# Patient Record
Sex: Female | Born: 1985 | Race: White | Hispanic: No | Smoking: Never smoker
Health system: Southern US, Community
[De-identification: ages and names within clinical notes are randomized; demographics above are authoritative.]

## PROBLEM LIST (undated history)

## (undated) ENCOUNTER — Ambulatory Visit (HOSPITAL_COMMUNITY): Admission: EM | Payer: 59 | Source: Home / Self Care

## (undated) DIAGNOSIS — F329 Major depressive disorder, single episode, unspecified: Secondary | ICD-10-CM

## (undated) DIAGNOSIS — F419 Anxiety disorder, unspecified: Secondary | ICD-10-CM

## (undated) DIAGNOSIS — F32A Depression, unspecified: Secondary | ICD-10-CM

## (undated) DIAGNOSIS — T7840XA Allergy, unspecified, initial encounter: Secondary | ICD-10-CM

## (undated) HISTORY — DX: Major depressive disorder, single episode, unspecified: F32.9

## (undated) HISTORY — DX: Depression, unspecified: F32.A

## (undated) HISTORY — PX: COLPOSCOPY: SHX161

## (undated) HISTORY — DX: Allergy, unspecified, initial encounter: T78.40XA

## (undated) HISTORY — DX: Anxiety disorder, unspecified: F41.9

---

## 2016-11-24 HISTORY — PX: WISDOM TOOTH EXTRACTION: SHX21

## 2017-06-16 ENCOUNTER — Encounter: Payer: Self-pay | Admitting: Family Medicine

## 2017-06-16 ENCOUNTER — Ambulatory Visit (INDEPENDENT_AMBULATORY_CARE_PROVIDER_SITE_OTHER): Payer: BLUE CROSS/BLUE SHIELD | Admitting: Family Medicine

## 2017-06-16 VITALS — BP 133/81 | HR 94 | Ht 65.5 in | Wt 162.0 lb

## 2017-06-16 DIAGNOSIS — Z803 Family history of malignant neoplasm of breast: Secondary | ICD-10-CM

## 2017-06-16 DIAGNOSIS — J3089 Other allergic rhinitis: Secondary | ICD-10-CM

## 2017-06-16 DIAGNOSIS — F32A Depression, unspecified: Secondary | ICD-10-CM

## 2017-06-16 DIAGNOSIS — E663 Overweight: Secondary | ICD-10-CM

## 2017-06-16 DIAGNOSIS — F329 Major depressive disorder, single episode, unspecified: Secondary | ICD-10-CM

## 2017-06-16 DIAGNOSIS — R87810 Cervical high risk human papillomavirus (HPV) DNA test positive: Secondary | ICD-10-CM

## 2017-06-16 DIAGNOSIS — Z8249 Family history of ischemic heart disease and other diseases of the circulatory system: Secondary | ICD-10-CM | POA: Diagnosis not present

## 2017-06-16 DIAGNOSIS — Z833 Family history of diabetes mellitus: Secondary | ICD-10-CM | POA: Diagnosis not present

## 2017-06-16 MED ORDER — VENLAFAXINE HCL 75 MG PO TABS: 75.0000 mg | ORAL_TABLET | Freq: Every day | ORAL | 1 refills | Status: DC

## 2017-06-16 NOTE — Patient Instructions (Signed)
Checkout lose it app, my fitness pal.  Also follow-up in 4 weeks and think about health and wellness goals that you could commit to.     Behavior Modification Ideas for Weight Management  Weight management involves adopting a healthy lifestyle that includes a knowledge of nutrition and exercise, a positive attitude and the right kind of motivation. Internal motives such as better health, increased energy, self-esteem and personal control increase your chances of lifelong weight management success.  Remember to have realistic goals and think long-term success. Believe in yourself and you can do it. The following information will give you ideas to help you meet your goals.  Control Your Home Environment  Eat only while sitting down at the kitchen or dining room table. Do not eat while watching television, reading, cooking, talking on the phone, standing at the refrigerator or working on the computer. Keep tempting foods out of the house - don't buy them. Keep tempting foods out of sight. Have low-calorie foods ready to eat. Unless you are preparing a meal, stay out of the kitchen. Have healthy snacks at your disposal, such as small pieces of fruit, vegetables, canned fruit, pretzels, low-fat string cheese and nonfat cottage cheese.  Control Your Work Environment  Do not eat at [employer] or keep tempting snacks at your desk. If you get hungry between meals, plan healthy snacks and bring them with you to work. During your breaks, go for a walk instead of eating. If you work around food, plan in advance the one item you will eat at mealtime. Make it inconvenient to nibble on food by chewing gum, sugarless candy or drinking water or another low-calorie beverage. Do not work through meals. Skipping meals slows down metabolism and may result in overeating at the next meal. If food is available for special occasions, either pick the healthiest item, nibble on low-fat snacks brought from home, don't  have anything offered, choose one option and have a small amount, or have only a beverage.  Control Your Mealtime Environment  Serve your plate of food at the stove or kitchen counter. Do not put the serving dishes on the table. If you do put dishes on the table, remove them immediately when finished eating. Fill half of your plate with vegetables, a quarter with lean protein and a quarter with starch. Use smaller plates, bowls and glasses. A smaller portion will look large when it is in a little dish. Politely refuse second helpings. When fixing your plate, limit portions of food to one scoop/serving or less.   Daily Food Management  Replace eating with another activity that you will not associate with food. Wait 20 minutes before eating something you are craving. Drink a large glass of water or diet soda before eating. Always have a big glass or bottle of water to drink throughout the day. Avoid high-calorie add-ons such as cream with your coffee, butter, mayonnaise and salad dressings.  Shopping: Do not shop when hungry or tired. Shop from a list and avoid buying anything that is not on your list. If you must have tempting foods, buy individual-sized packages and try to find a lower-calorie alternative. Don't taste test in the store. Read food labels. Compare products to help you make the healthiest choices.  Preparation: Chew a piece of gum while cooking meals. Use a quarter teaspoon if you taste test your food. Try to only fix what you are going to eat, leaving yourself no chance for seconds. If you have prepared more  food than you need, portion it into individual containers and freeze or refrigerate immediately. Don't snack while cooking meals.  Eating: Eat slowly. Remember it takes about 20 minutes for your stomach to send a message to your brain that it is full. Don't let fake hunger make you think you need more. The ideal way to eat is to take a bite, put your utensil  down, take a sip of water, cut your next bite, take a bit, put your utensil down and so on. Do not cut your food all at one time. Cut only as needed. Take small bites and chew your food well. Stop eating for a minute or two at least once during a meal or snack. Take breaks to reflect and have conversation.  Cleanup and Leftovers: Label leftovers for a specific meal or snack. Freeze or refrigerate individual portions of leftovers. Do not clean up if you are still hungry.  Eating Out and Social Eating  Do not arrive hungry. Eat something light before the meal. Try to fill up on low-calorie foods, such as vegetables and fruit, and eat smaller portions of the high-calorie foods. Eat foods that you like, but choose small portions. If you want seconds, wait at least 20 minutes after you have eaten to see if you are actually hungry or if your eyes are bigger than your stomach. Limit alcoholic beverages. Try a soda water with a twist of lime. Do not skip other meals in the day to save room for the special event.  At Restaurants: Order  la carte rather than buffet style. Order some vegetables or a salad for an appetizer instead of eating bread. If you order a high-calorie dish, share it with someone. Try an after-dinner mint with your coffee. If you do have dessert, share it with two or more people. Don't overeat because you do not want to waste food. Ask for a doggie bag to take extra food home. Tell the server to put half of your entree in a to go bag before the meal is served to you. Ask for salad dressing, gravy or high-fat sauces on the side. Dip the tip of your fork in the dressing before each bite. If bread is served, ask for only one piece. Try it plain without butter or oil. At [employer] where oil and vinegar is served with bread, use only a small amount of oil and a lot of vinegar for dipping.  At a Friend's House: Offer to bring a dish, appetizer or dessert that is low in  calories. Serve yourself small portions or tell the host that you only want a small amount. Stand or sit away from the snack table. Stay away from the kitchen or stay busy if you are near the food. Limit your alcohol intake.  At [employer] and Cafeterias: Cover most of your plate with lettuce and/or vegetables. Use a salad plate instead of a dinner plate. After eating, clear away your dishes before having coffee or tea.  Entertaining at Home: Explore low-fat, low-cholesterol cookbooks. Use single-serving foods like chicken breasts or hamburger patties. Prepare low-calorie appetizers and desserts.   Holidays: Keep tempting foods out of sight. Decorate the house without using food. Have low-calorie beverages and foods on hand for guests. Allow yourself one planned treat a day. Don't skip meals to save up for the holiday feast. Eat regular, planned meals.   Exercise Well  Make exercise a priority and a planned activity in the day. If possible, walk the entire  or part of the distance to work. Get an exercise buddy. Go for a walk with a colleague during one of your breaks, go to the gym, run or take a walk with a friend, walk in the mall with a shopping companion. Park at the end of the parking lot and walk to the store or office entrance. Always take the stairs all of the way or at least part of the way to your floor. If you have a desk job, walk around the office frequently. Do leg lifts while sitting at your desk. Do something outside on the weekends like going for a hike or a bike ride.   Have a Healthy Attitude  Make health your weight management priority. Be realistic. Have a goal to achieve a healthier you, not necessarily the lowest weight or ideal weight based on calculations or tables. Focus on a healthy eating style, not on dieting. Dieting usually lasts for a short amount of time and rarely produces long-term success. Think long term. You are developing new healthy  behaviors to follow next month, in a year and in a decade.    This information is for educational purposes only and is not intended to replace the advice of your doctor or health care provider. We encourage you to discuss with your doctor any questions or concerns you may have.        Guidelines for Losing Weight   We want weight loss that will last so you should lose 1-2 pounds a week.  THAT IS IT! Please pick THREE things a month to change. Once it is a habit check off the item. Then pick another three items off the list to become habits.  If you are already doing a habit on the list GREAT!  Cross that item off!  Don't drink your calories. Ie, alcohol, soda, fruit juice, and sweet tea.   Drink more water. Drink a glass when you feel hungry or before each meal.   Eat breakfast - Complex carb and protein (likeDannon light and fit yogurt, oatmeal, fruit, eggs, [place] bacon).  Measure your cereal.  Eat no more than one cup a day. (ie Kashi)  Eat an apple a day.  Add a vegetable a day.  Try a new vegetable a month.  Use Pam! Stop using oil or butter to cook.  Don't finish your plate or use smaller plates.  Share your dessert.  Eat sugar free Jello for dessert or frozen grapes.  Don't eat 2-3 hours before bed.  Switch to whole wheat bread, pasta, and brown rice.  Make healthier choices when you eat out. No fries!  Pick baked chicken, NOT fried.  Don't forget to SLOW DOWN when you eat. It is not going anywhere.   Take the stairs.  Park far away in the parking lot  Lift soup cans (or weights) for 10 minutes while watching TV.  Walk at work for 10 minutes during break.  Walk outside 1 time a week with your friend, kids, dog, or significant other.  Start a walking group at church.  Walk the mall as much as you can tolerate.   Keep a food diary.  Weigh yourself daily.  Walk for 15 minutes 3 days per week.  Cook at home more often and eat out less. If life  happens and you go back to old habits, it is okay.  Just start over. You can do it!  If you experience chest pain, get short of breath, or tired during  the exercise, please stop immediately and inform your doctor.    Before you even begin to attack a weight-loss plan, it pays to remember this: You are not fat. You have fat. Losing weight isn't about blame or shame; it's simply another achievement to accomplish. Dieting is like any other skill-you have to buckle down and work at it. As long as you act in a smart, reasonable way, you'll ultimately get where you want to be. Here are some weight loss pearls for you.   1. It's Not a Diet. It's a Lifestyle Thinking of a diet as something you're on and suffering through only for the short term doesn't work. To shed weight and keep it off, you need to make permanent changes to the way you eat. It's OK to indulge occasionally, of course, but if you cut calories temporarily and then revert to your old way of eating, you'll gain back the weight quicker than you can say yo-yo. Use it to lose it. Research shows that one of the best predictors of long-term weight loss is how many pounds you drop in the first month. For that reason, nutritionists often suggest being stricter for the first two weeks of your new eating strategy to build momentum. Cut out added sugar and alcohol and avoid unrefined carbs. After that, figure out how you can reincorporate them in a way that's healthy and maintainable.  2. There's a Right Way to Exercise Working out burns calories and fat and boosts your metabolism by building muscle. But those trying to lose weight are notorious for overestimating the number of calories they burn and underestimating the amount they take in. Unfortunately, your system is biologically programmed to hold on to extra pounds and that means when you start exercising, your body senses the deficit and ramps up its hunger signals. If you're not diligent, you'll eat  everything you burn and then some. Use it, to lose it. Cardio gets all the exercise glory, but strength and interval training are the real heroes. They help you build lean muscle, which in turn increases your metabolism and calorie-burning ability 3. Don't Overreact to Mild Hunger Some people have a hard time losing weight because of hunger anxiety. To them, being hungry is bad-something to be avoided at all costs-so they carry snacks with them and eat when they don't need to. Others eat because they're stressed out or bored. While you never want to get to the point of being ravenous (that's when bingeing is likely to happen), a hunger pang, a craving, or the fact that it's 3:00 p.m. should not send you racing for the vending machine or obsessing about the energy bar in your purse. Ideally, you should put off eating until your stomach is growling and it's difficult to concentrate.  Use it to lose it. When you feel the urge to eat, use the HALT method. Ask yourself, Am I really hungry? Or am I angry or anxious, lonely or bored, or tired? If you're still not certain, try the apple test. If you're truly hungry, an apple should seem delicious; if it doesn't, something else is going on. Or you can try drinking water and making yourself busy, if you are still hungry try a healthy snack.  4. Not All Calories Are Created Equal The mechanics of weight loss are pretty simple: Take in fewer calories than you use for energy. But the kind of food you eat makes all the difference. Processed food that's high in saturated fat and refined  starch or sugar can cause inflammation that disrupts the hormone signals that tell your brain you're full. The result: You eat a lot more.  Use it to lose it. Clean up your diet. Swap in whole, unprocessed foods, including vegetables, lean protein, and healthy fats that will fill you up and give you the biggest nutritional bang for your calorie buck. In a few weeks, as your brain starts  receiving regular hunger and fullness signals once again, you'll notice that you feel less hungry overall and naturally start cutting back on the amount you eat.  5. Protein, Produce, and Plant-Based Fats Are Your Weight-Loss Trinity Here's why eating the three Ps regularly will help you drop pounds. Protein fills you up. You need it to build lean muscle, which keeps your metabolism humming so that you can torch more fat. People in a weight-loss program who ate double the recommended daily allowance for protein (about 110 grams for a 150-pound woman) lost 70 percent of their weight from fat, while people who ate the RDA lost only about 40 percent, one study found. Produce is packed with filling fiber. "It's very difficult to consume too many calories if you're eating a lot of vegetables. Example: Three cups of broccoli is a lot of food, yet only 93 calories. (Fruit is another story. It can be easy to overeat and can contain a lot of calories from sugar, so be sure to monitor your intake.) Plant-based fats like olive oil and those in avocados and nuts are healthy and extra satiating.  Use it to lose it. Aim to incorporate each of the three Ps into every meal and snack. People who eat protein throughout the day are able to keep weight off, according to a study in the American Journal of Clinical Nutrition. In addition to meat, poultry and seafood, good sources are beans, lentils, eggs, tofu, and yogurt. As for fat, keep portion sizes in check by measuring out salad dressing, oil, and nut butters (shoot for one to two tablespoons). Finally, eat veggies or a little fruit at every meal. People who did that consumed 308 fewer calories but didn't feel any hungrier than when they didn't eat more produce.  7. How You Eat Is As Important As What You Eat In order for your brain to register that you're full, you need to focus on what you're eating. Sit down whenever you eat, preferably at a table. Turn off the TV or  computer, put down your phone, and look at your food. Smell it. Chew slowly, and don't put another bite on your fork until you swallow. When women ate lunch this attentively, they consumed 30 percent less when snacking later than those who listened to an audiobook at lunchtime, according to a study in the [place] Journal of Nutrition. 8. Weighing Yourself Really Works The scale provides the best evidence about whether your efforts are paying off. Seeing the numbers tick up or down or stagnate is motivation to keep going-or to rethink your approach. A 2015 study at [facility] found that daily weigh-ins helped people lose more weight, keep it off, and maintain that loss, even after two years. Use it to lose it. Step on the scale at the same time every day for the best results. If your weight shoots up several pounds from one weigh-in to the next, don't freak out. Eating a lot of salt the night before or having your period is the likely culprit. The number should return to normal in a day  or two. It's a steady climb that you need to do something about. 9. Too Much Stress and Too Little Sleep Are Your Enemies When you're tired and frazzled, your body cranks up the production of cortisol, the stress hormone that can cause carb cravings. Not getting enough sleep also boosts your levels of ghrelin, a hormone associated with hunger, while suppressing leptin, a hormone that signals fullness and satiety. People on a diet who slept only five and a half hours a night for two weeks lost 55 percent less fat and were hungrier than those who slept eight and a half hours, according to a study in the [place] [employer] Journal. Use it to lose it. Prioritize sleep, aiming for seven hours or more a night, which research shows helps lower stress. And make sure you're getting quality zzz's. If a snoring spouse or a fidgety cat wakes you up frequently throughout the night, you may end up getting the equivalent of  just four hours of sleep, according to a study from [facility]. Keep pets out of the bedroom, and use a white-noise app to drown out snoring. 10. You Will Hit a plateau-And You Can Bust Through It As you slim down, your body releases much less leptin, the fullness hormone.  If you're not strength training, start right now. Building muscle can raise your metabolism to help you overcome a plateau. To keep your body challenged and burning calories, incorporate new moves and more intense intervals into your workouts or add another sweat session to your weekly routine. Alternatively, cut an extra 100 calories or so a day from your diet. Now that you've lost weight, your body simply doesn't need as much fuel.    Since food equals calories, in order to lose weight you must either eat fewer calories, exercise more to burn off calories with activity, or both. Food that is not used to fuel the body is stored as fat. A major component of losing weight is to make smarter food choices. Here's how:  1)   Limit non-nutritious foods, such as: Sugar, honey, syrups and candy Pastries, donuts, pies, cakes and cookies Soft drinks, sweetened juices and alcoholic beverages  2)  Cut down on high-fat foods by: - Choosing poultry, fish or lean red meat - Choosing low-fat cooking methods, such as baking, broiling, steaming, grilling and boiling - Using low-fat or non-fat dairy products - Using vinaigrette, herbs, lemon or fat-free salad dressings - Avoiding fatty meats, such as bacon, sausage, franks, ribs and luncheon meats - Avoiding high-fat snacks like nuts, chips and chocolate - Avoiding fried foods - Using less butter, margarine, oil and mayonnaise - Avoiding high-fat gravies, cream sauces and cream-based soups  3) Eat a variety of foods, including: - Fruit and vegetables that are raw, steamed or baked - Whole grains, breads, cereal, rice and pasta - Dairy products, such as low-fat or non-fat milk  or yogurt, low-fat cottage cheese and low-fat cheese - Protein-rich foods like chicken, [place], fish, lean meat and legumes, or beans  4) Change your eating habits by: - Eat three balanced meals a day to help control your hunger - Watch portion sizes and eat small servings of a variety of foods - Choose low-calorie snacks - Eat only when you are hungry and stop when you are satisfied - Eat slowly and try not to perform other tasks while eating - Find other activities to distract you from food, such as walking, taking up a hobby or being involved  in the community - Include regular exercise in your daily routine ( minimum of 20 min of moderate-intensity exercise at least 5 days/week)  - Find a support group, if necessary, for emotional support in your weight loss journey           Easy ways to cut 100 calories   1. Eat your eggs with hot sauce OR salsa instead of cheese.  Eggs are great for breakfast, but many people consider eggs and cheese to be BFFs. Instead of cheese-1 oz. of cheddar has 114 calories-top your eggs with hot sauce, which contains no calories and helps with satiety and metabolism. Salsa is also a great option!!  2. Top your toast, waffles or pancakes with fresh berries instead of jelly or syrup. Half a cup of berries-fresh, frozen or thawed-has about 40 calories, compared with 2 tbsp. of maple syrup or jelly, which both have about 100 calories. The berries will also give you a good punch of fiber, which helps keep you full and satisfied and won't spike blood sugar quickly like the jelly or syrup. 3. Swap the non-fat latte for black coffee with a splash of half-and-half. Contrary to its name, that non-fat latte has 130 calories and a startling 19g of carbohydrates per 16 oz. serving. Replacing that 'light' drinkable dessert with a black coffee with a splash of half-and-half saves you more than 100 calories per 16 oz. serving. 4. Sprinkle salads with freeze-dried  raspberries instead of dried cranberries. If you want a sweet addition to your nutritious salad, stay away from dried cranberries. They have a whopping 130 calories per  cup and 30g carbohydrates. Instead, sprinkle freeze-dried raspberries guilt-free and save more than 100 calories per  cup serving, adding 3g of belly-filling fiber. 5. Go for mustard in place of mayo on your sandwich. Mustard can add really nice flavor to any sandwich, and there are tons of varieties, from spicy to honey. A serving of mayo is 95 calories, versus 10 calories in a serving of mustard.  Or try an avocado mayo spread: You can find the recipe few click this link: https://www.californiaavocado.com/recipes/recipe-container/california-avocado-mayo 6. Choose a DIY salad dressing instead of the store-bought kind. Mix Dijon or whole grain mustard with low-fat Kefir or red wine vinegar and garlic. 7. Use hummus as a spread instead of a dip. Use hummus as a spread on a high-fiber cracker or tortilla with a sandwich and save on calories without sacrificing taste. 8. Pick just one salad "accessory." Salad isn't automatically a calorie winner. It's easy to over-accessorize with toppings. Instead of topping your salad with nuts, avocado and cranberries (all three will clock in at 313 calories), just pick one. The next day, choose a different accessory, which will also keep your salad interesting. You don't wear all your jewelry every day, right? 9. Ditch the white pasta in favor of spaghetti squash. One cup of cooked spaghetti squash has about 40 calories, compared with traditional spaghetti, which comes with more than 200. Spaghetti squash is also nutrient-dense. It's a good source of fiber and Vitamins A and C, and it can be eaten just like you would eat pasta-with a great tomato sauce and [place] meatballs or with pesto, tofu and spinach, for example. 10. Dress up your chili, soups and stews with non-fat [place] yogurt instead of sour  cream. Just a 'dollop' of sour cream can set you back 115 calories and a whopping 12g of fat-seven of which are of the artery-clogging variety. Added bonus: [place] yogurt  is packed with muscle-building protein, calcium and B Vitamins. 11. Mash cauliflower instead of mashed potatoes. One cup of traditional mashed potatoes-in all their creamy goodness-has more than 200 calories, compared to mashed cauliflower, which you can typically eat for less than 100 calories per 1 cup serving. Cauliflower is a great source of the antioxidant indole-3-carbinol (I3C), which may help reduce the risk of some cancers, like breast cancer. 12. Ditch the ice cream sundae in favor of a [place] yogurt parfait. Instead of a cup of ice cream or fro-yo for dessert, try 1 cup of nonfat Greek yogurt topped with fresh berries and a sprinkle of cacao nibs. Both toppings are packed with antioxidants, which can help reduce cellular inflammation and oxidative damage. And the comparison is a no-brainer: One cup of ice cream has about 275 calories; one cup of frozen yogurt has about 230; and a cup of Greek yogurt has just 130, plus twice the protein, so you're less likely to return to the freezer for a second helping. 13. Put olive oil in a spray container instead of using it directly from the bottle. Each tablespoon of olive oil is 120 calories and 15g of fat. Use a mister instead of pouring it straight into the pan or onto a salad. This allows for portion control and will save you more than 100 calories. 14. When baking, substitute canned pumpkin for butter or oil. Canned pumpkin-not pumpkin pie mix-is loaded with Vitamin A, which is important for skin and eye health, as well as immunity. And the comparisons are pretty crazy:  cup of canned pumpkin has about 40 calories, compared to butter or oil, which has more than 800 calories. Yes, 800 calories. Applesauce and mashed banana can also serve as good substitutions for butter or oil, usually  in a 1:1 ratio. 15. Top casseroles with high-fiber cereal instead of breadcrumbs. Breadcrumbs are typically made with white bread, while breakfast cereals contain 5-9g of fiber per serving. Not only will you save more than 150 calories per  cup serving, the swap will also keep you more full and you'll get a metabolism boost from the added fiber. 16. Snack on pistachios instead of macadamia nuts. Believe it or not, you get the same amount of calories from 35 pistachios (100 calories) as you would from only five macadamia nuts. 17. Chow down on kale chips rather than potato chips. This is my favorite 'don't knock it 'till you try it' swap. Kale chips are so easy to make at home, and you can spice them up with a little grated parmesan or chili powder. Plus, they're a mere fraction of the calories of potato chips, but with the same crunch factor we crave so often. 18. Add seltzer and some fruit slices to your cocktail instead of soda or fruit juice. One cup of soda or fruit juice can pack on as much as 140 calories. Instead, use seltzer and fruit slices. The fruit provides valuable phytochemicals, such as flavonoids and anthocyanins, which help to combat cancer and stave off the aging process.

## 2017-06-16 NOTE — Progress Notes (Signed)
New patient office visit note:  Impression and Recommendations:    1. Overweight (BMI 25.0-29.9)   2. Depression, unspecified depression type   3. Environmental and seasonal allergies   4. Family history of diabetes mellitus in mother   5. Family history of hypertension- mom   6. Family history of breast cancer- in M aunt in her early 54's   82. Pap smear of cervix shows high risk HPV present- in past, had colposcopy,    - Checkout lose it app, my fitness pal.    - Also follow-up in 4 weeks and think about health and wellness goals that you could commit to.   Gross side effects, risk and benefits, and alternatives of medications discussed with patient.  Patient is aware that all medications have potential side effects and we are unable to predict every side effect or drug-drug interaction that may occur.  Expresses verbal understanding and consents to current therapy plan and treatment regimen.  Return in about 4 weeks (around 07/14/2017) for Discussed weight loss, come fasting as we will obtain labs.  Please see AVS handed out to patient at the end of our visit for further patient instructions/ counseling done pertaining to today's office visit.    Note: This document was prepared using Dragon voice recognition software and may include unintentional dictation errors.  ----------------------------------------------------------------------------------------------------------------------    Subjective:    Chief complaint:   Chief Complaint  Patient presents with  . Establish Care     HPI: Yesenia Johnson is a pleasant 31 y.o. female who presents to [facility] today to review their medical history with me and establish care.   I asked the patient to review their chronic problem list with me to ensure everything was updated and accurate.    All recent office visits with other providers, any medical records that patient brought in etc  - I  reviewed today.     Also asked pt to get me medical records from [facility] providers/ specialists that they had seen within the past 3-5 years- if they are in private practice and/or do not work for a [employer], [facility], [facility], [facility] or [facility] owned practice.  Told them to call their specialists to clarify this if they are not sure.   Was walking 6-7 miles per day in [place].  maintaines usually at 125-130. 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     No problems updated.    Wt Readings from Last 3 Encounters:  06/16/17 162 lb (73.5 kg)   BP Readings from Last 3 Encounters:  06/16/17 133/81   Pulse Readings from Last 3 Encounters:  06/16/17 94   BMI Readings from Last 3 Encounters:  06/16/17 26.55 kg/m    Patient Care Team    Relationship Specialty Notifications Start End  Thomasene Lot, DO PCP - General Family Medicine  06/16/17     Patient Active Problem List   Diagnosis Date Noted  . Depression 06/16/2017  . Overweight (BMI 25.0-29.9) 06/16/2017  . Environmental and seasonal allergies 06/16/2017  . Family history of diabetes mellitus in mother 06/16/2017  . Family history of hypertension- mom 06/16/2017  . Family history of breast cancer- in M aunt in her early 79's 06/16/2017  . Pap smear of cervix shows high risk HPV present- in past, had colposcopy, 06/16/2017     Past Medical History:  Diagnosis Date  . Depression      Past Medical History:  Diagnosis Date  . Depression      Past Surgical History:  Procedure Laterality Date  . WISDOM TOOTH EXTRACTION  11/2016     Family History  Problem Relation Age of Onset  . Hypertension Mother   . Diabetes Mother   . Stroke Maternal Grandmother   . Diabetes Paternal Grandmother   . Breast cancer Maternal Aunt   . Alcoholism Maternal Aunt   . Diabetes Paternal Aunt      Social History   Substance and Sexual Activity  Drug Use No     Social History   Substance and Sexual Activity  Alcohol Use Yes    . Alcohol/week: 1.2 oz  . Types: 2 Cans of beer per week     Social History   Tobacco Use  Smoking Status Never Smoker  Smokeless Tobacco Never Used     Outpatient Encounter Medications as of 06/16/2017  Medication Sig  . [DISCONTINUED] venlafaxine (EFFEXOR) 75 MG tablet Take 75 mg by mouth daily.  . [DISCONTINUED] venlafaxine (EFFEXOR) 75 MG tablet Take 1 tablet (75 mg total) by mouth daily.   No facility-administered encounter medications on file as of 06/16/2017.     Allergies: Penicillins   Review of Systems  Constitutional: Negative for chills, diaphoresis, fever, malaise/fatigue and weight loss.  HENT: Negative for congestion, sore throat and tinnitus.   Eyes: Negative for blurred vision, double vision and photophobia.  Respiratory: Negative for cough and wheezing.   Cardiovascular: Negative for chest pain and palpitations.  Gastrointestinal: Negative for blood in stool, diarrhea, nausea and vomiting.  Genitourinary: Negative for dysuria, frequency and urgency.  Musculoskeletal: Negative for joint pain and myalgias.  Skin: Negative for itching and rash.  Neurological: Negative for dizziness, focal weakness, weakness and headaches.  Endo/Heme/Allergies: Positive for environmental allergies. Negative for polydipsia. Does not bruise/bleed easily.  Psychiatric/Behavioral: Negative for depression and memory loss. The patient is not nervous/anxious and does not have insomnia.      Objective:   Blood pressure 133/81, pulse 94, height 5' 5.5" (1.664 m), weight 162 lb (73.5 kg), last menstrual period 05/10/2017. Body mass index is 26.55 kg/m. General: Well Developed, well nourished, and in no acute distress.  Neuro: Alert and oriented x3, extra-ocular muscles intact, sensation grossly intact.  HEENT:[place]/AT, PERRLA, neck supple, No carotid bruits Skin: no gross rashes  Cardiac: Regular rate and rhythm Respiratory: Essentially clear to auscultation bilaterally. Not using  accessory muscles, speaking in full sentences.  Abdominal: not grossly distended Musculoskeletal: Ambulates w/o diff, FROM * 4 ext.  Vasc: less 2 sec cap RF, warm and pink  Psych:  No HI/SI, judgement and insight good, Euthymic mood. Full Affect.    Recent Results (from the past 2160 hour(s))  CBC with Differential/Platelet     Status: Abnormal   Collection Time: 01/18/18  8:29 AM  Result Value Ref Range   WBC 7.1 3.4 - 10.8 x10E3/uL   RBC 4.56 3.77 - 5.28 x10E6/uL   Hemoglobin 13.9 11.1 - 15.9 g/dL   Hematocrit 16.1 09.6 - 46.6 %   MCV 88 79 - 97 fL   MCH 30.5 26.6 - 33.0 pg   MCHC 34.5 31.5 - 35.7 g/dL   RDW 04.5 40.9 - 81.1 %   Platelets 384 (H) 150 - 379 x10E3/uL   Neutrophils 51 Not Estab. %   Lymphs 38 Not Estab. %   Monocytes 8 Not Estab. %   Eos 2 Not Estab. %   Basos 1 Not Estab. %  Neutrophils Absolute 3.6 1.4 - 7.0 x10E3/uL   Lymphocytes Absolute 2.7 0.7 - 3.1 x10E3/uL   Monocytes Absolute 0.6 0.1 - 0.9 x10E3/uL   EOS (ABSOLUTE) 0.2 0.0 - 0.4 x10E3/uL   Basophils Absolute 0.1 0.0 - 0.2 x10E3/uL   Immature Granulocytes 0 Not Estab. %   Immature Grans (Abs) 0.0 0.0 - 0.1 x10E3/uL  Comprehensive metabolic panel     Status: Abnormal   Collection Time: 01/18/18  8:29 AM  Result Value Ref Range   Glucose 81 65 - 99 mg/dL   BUN 12 6 - 20 mg/dL   Creatinine, Ser 1.61 0.57 - 1.00 mg/dL   GFR calc non Af Amer 98 >59 mL/min/1.73   GFR calc Af Amer 113 >59 mL/min/1.73   BUN/Creatinine Ratio 15 9 - 23   Sodium 139 134 - 144 mmol/L   Potassium 4.3 3.5 - 5.2 mmol/L   Chloride 104 96 - 106 mmol/L   CO2 21 20 - 29 mmol/L   Calcium 9.0 8.7 - 10.2 mg/dL   Total Protein 7.5 6.0 - 8.5 g/dL   Albumin 4.6 3.5 - 5.5 g/dL   Globulin, Total 2.9 1.5 - 4.5 g/dL   Albumin/Globulin Ratio 1.6 1.2 - 2.2   Bilirubin Total 1.1 0.0 - 1.2 mg/dL   Alkaline Phosphatase 85 39 - 117 IU/L   AST 22 0 - 40 IU/L   ALT 39 (H) 0 - 32 IU/L  Hemoglobin A1c     Status: None   Collection Time:  01/18/18  8:29 AM  Result Value Ref Range   Hgb A1c MFr Bld 4.8 4.8 - 5.6 %    Comment:          Prediabetes: 5.7 - 6.4          Diabetes: >6.4          Glycemic control for adults with diabetes: <7.0    Est. average glucose Bld gHb Est-mCnc 91 mg/dL  Lipid panel     Status: Abnormal   Collection Time: 01/18/18  8:29 AM  Result Value Ref Range   Cholesterol, Total 193 100 - 199 mg/dL   Triglycerides 70 0 - 149 mg/dL   HDL 62 >09 mg/dL   VLDL Cholesterol Cal 14 5 - 40 mg/dL   LDL Calculated 604 (H) 0 - 99 mg/dL   Chol/HDL Ratio 3.1 0.0 - 4.4 ratio    Comment:                                   T. Chol/HDL Ratio                                             Men  Women                               1/2 Avg.Risk  3.4    3.3                                   Avg.Risk  5.0    4.4  2X Avg.Risk  9.6    7.1                                3X Avg.Risk 23.4   11.0   TSH     Status: None   Collection Time: 01/18/18  8:29 AM  Result Value Ref Range   TSH 3.370 0.450 - 4.500 uIU/mL

## 2017-07-20 ENCOUNTER — Ambulatory Visit: Payer: BLUE CROSS/BLUE SHIELD | Admitting: Family Medicine

## 2018-01-18 ENCOUNTER — Other Ambulatory Visit: Payer: Self-pay

## 2018-01-18 ENCOUNTER — Other Ambulatory Visit: Payer: BLUE CROSS/BLUE SHIELD

## 2018-01-18 DIAGNOSIS — Z0000 Encounter for general adult medical examination without abnormal findings: Secondary | ICD-10-CM

## 2018-01-18 DIAGNOSIS — F32A Depression, unspecified: Secondary | ICD-10-CM

## 2018-01-18 DIAGNOSIS — Z833 Family history of diabetes mellitus: Secondary | ICD-10-CM | POA: Diagnosis not present

## 2018-01-18 DIAGNOSIS — E663 Overweight: Secondary | ICD-10-CM | POA: Diagnosis not present

## 2018-01-18 DIAGNOSIS — F329 Major depressive disorder, single episode, unspecified: Secondary | ICD-10-CM

## 2018-01-18 MED ORDER — VENLAFAXINE HCL 75 MG PO TABS: 75.0000 mg | ORAL_TABLET | Freq: Every day | ORAL | 0 refills | Status: DC

## 2018-01-18 NOTE — Telephone Encounter (Signed)
Pharmacy sent over a refill request for Effexor 75 mg.  Reviewed chart, patient was last seen on 06/16/2017 and advised to follow up in 4 weeks. Patient has not had a follow up since the 06/16/2017 appointment; she is scheduled for appointment on 01/21/2018.  Per office policy refill was sent in for 30 days until patient can follow up in the office. MPulliam, CMA/RT(R)

## 2018-01-19 LAB — TSH: TSH: 3.37 u[IU]/mL (ref 0.450–4.500)

## 2018-01-19 LAB — CBC WITH DIFFERENTIAL/PLATELET
BASOS: 1 %
Basophils Absolute: 0.1 10*3/uL (ref 0.0–0.2)
EOS (ABSOLUTE): 0.2 10*3/uL (ref 0.0–0.4)
EOS: 2 %
HEMATOCRIT: 40.3 % (ref 34.0–46.6)
Hemoglobin: 13.9 g/dL (ref 11.1–15.9)
Immature Grans (Abs): 0 10*3/uL (ref 0.0–0.1)
Immature Granulocytes: 0 %
LYMPHS ABS: 2.7 10*3/uL (ref 0.7–3.1)
Lymphs: 38 %
MCH: 30.5 pg (ref 26.6–33.0)
MCHC: 34.5 g/dL (ref 31.5–35.7)
MCV: 88 fL (ref 79–97)
MONOS ABS: 0.6 10*3/uL (ref 0.1–0.9)
Monocytes: 8 %
NEUTROS ABS: 3.6 10*3/uL (ref 1.4–7.0)
Neutrophils: 51 %
PLATELETS: 384 10*3/uL — AB (ref 150–379)
RBC: 4.56 x10E6/uL (ref 3.77–5.28)
RDW: 12.8 % (ref 12.3–15.4)
WBC: 7.1 10*3/uL (ref 3.4–10.8)

## 2018-01-19 LAB — COMPREHENSIVE METABOLIC PANEL
A/G RATIO: 1.6 (ref 1.2–2.2)
ALT: 39 IU/L — AB (ref 0–32)
AST: 22 IU/L (ref 0–40)
Albumin: 4.6 g/dL (ref 3.5–5.5)
Alkaline Phosphatase: 85 IU/L (ref 39–117)
BILIRUBIN TOTAL: 1.1 mg/dL (ref 0.0–1.2)
BUN/Creatinine Ratio: 15 (ref 9–23)
BUN: 12 mg/dL (ref 6–20)
CHLORIDE: 104 mmol/L (ref 96–106)
CO2: 21 mmol/L (ref 20–29)
Calcium: 9 mg/dL (ref 8.7–10.2)
Creatinine, Ser: 0.8 mg/dL (ref 0.57–1.00)
GFR calc Af Amer: 113 mL/min/{1.73_m2} (ref >59)
GFR, EST NON AFRICAN AMERICAN: 98 mL/min/{1.73_m2} (ref >59)
GLOBULIN, TOTAL: 2.9 g/dL (ref 1.5–4.5)
Glucose: 81 mg/dL (ref 65–99)
POTASSIUM: 4.3 mmol/L (ref 3.5–5.2)
SODIUM: 139 mmol/L (ref 134–144)
Total Protein: 7.5 g/dL (ref 6.0–8.5)

## 2018-01-19 LAB — LIPID PANEL
Chol/HDL Ratio: 3.1 ratio (ref 0.0–4.4)
Cholesterol, Total: 193 mg/dL (ref 100–199)
HDL: 62 mg/dL (ref >39)
LDL CALC: 117 mg/dL — AB (ref 0–99)
Triglycerides: 70 mg/dL (ref 0–149)
VLDL CHOLESTEROL CAL: 14 mg/dL (ref 5–40)

## 2018-01-19 LAB — HEMOGLOBIN A1C
ESTIMATED AVERAGE GLUCOSE: 91 mg/dL
Hgb A1c MFr Bld: 4.8 % (ref 4.8–5.6)

## 2018-01-21 ENCOUNTER — Ambulatory Visit: Payer: BLUE CROSS/BLUE SHIELD | Admitting: Family Medicine

## 2018-01-21 ENCOUNTER — Encounter: Payer: Self-pay | Admitting: Family Medicine

## 2018-01-21 VITALS — BP 134/85 | HR 84 | Ht 65.5 in | Wt 156.3 lb

## 2018-01-21 DIAGNOSIS — Z713 Dietary counseling and surveillance: Secondary | ICD-10-CM | POA: Insufficient documentation

## 2018-01-21 DIAGNOSIS — Z7189 Other specified counseling: Secondary | ICD-10-CM | POA: Diagnosis not present

## 2018-01-21 DIAGNOSIS — E663 Overweight: Secondary | ICD-10-CM

## 2018-01-21 DIAGNOSIS — F329 Major depressive disorder, single episode, unspecified: Secondary | ICD-10-CM

## 2018-01-21 DIAGNOSIS — F32A Depression, unspecified: Secondary | ICD-10-CM

## 2018-01-21 MED ORDER — VENLAFAXINE HCL 37.5 MG PO TABS: 37.5000 mg | ORAL_TABLET | Freq: Two times a day (BID) | ORAL | 0 refills | Status: DC

## 2018-01-21 NOTE — Progress Notes (Signed)
Impression and Recommendations:    1. Overweight (BMI 25.0-29.9)   2. Depression, unspecified depression type   3. Counseling on health promotion and disease prevention   4. Nutritional counseling     1. Overweight BMI 25.0-29.9 -Dietary and exercise guidelines discussed with patient. Recommended pt to reduce intake of saturated, trans fats and fatty carbohydrates. Handouts provided.  -Information provided on Mediterranean diet. Recommended eating more chicken, [place], and fish.  -Discussed the Lose It and My Fitness Pal food tracking apps. Told pt she does not need to lose weight, but get more in shape and being healthy.  -Discussed potential future referral to nutritionist, if desired.  2. Depression: Pt is doing very well recently with her mood.  Patient would like to try to wean down on her medications.  She is not currently going to a counselor but has in the past and feels she has good stress management skills.  She also has good support of her husband and friends in [place].  Dose change:  -reduce effexor from 75 mg to 37.5 mg QD for 2 weeks. After 2 weeks, take half a tablet QD for 2 weeks, then take this every other day. You may need to take this every third day, if needed, before stopping. -Exercise 30 mins 5x a day, slowly wake your way up to this. Can exercise 15 minutes in the morning and 15 minutes in the evening if needed. -recheck 4 months.    Education and routine counseling performed. Handouts provided.   Meds ordered this encounter  Medications  . venlafaxine (EFFEXOR) 37.5 MG tablet    Sig: Take 1 tablet (37.5 mg total) by mouth 2 (two) times daily.    Dispense:  60 tablet    Refill:  0    Gross side effects, risk and benefits, and alternatives of medications and treatment plan in general discussed with patient.  Patient is aware that all medications have potential side effects and we are unable to predict every side effect or drug-drug interaction that may  occur.   Patient will call with any questions prior to using medication if they have concerns.  Expresses verbal understanding and consents to current therapy and treatment regimen.  No barriers to understanding were identified.  Red flag symptoms and signs discussed in detail.  Patient expressed understanding regarding what to do in case of emergency\urgent symptoms  Please see AVS handed out to patient at the end of our visit for further patient instructions/ counseling done pertaining to today's office visit.   Return in about 4 months (around 05/21/2018) for [place] diet and mood check since weaning off Effexor.     Note: This document was prepared using Dragon voice recognition software and may include unintentional dictation errors.  This document serves as a record of services personally performed by Thomasene Loteborah Giselle Brutus, DO. It was created on her behalf by Thelma BargeNick Cochran, a trained medical scribe. The creation of this record is based on the scribe's personal observations and the provider's statements to them.   I have reviewed the above medical documentation for accuracy and completeness and I concur.  Thomasene LotDeborah Alecia Doi 01/21/18 9:27 AM  -------------------------------------------------------------------------------------------------------------------------------------------------------------------------------------------------------------------------------------------- Subjective:    HPI: Yesenia Johnson is a 32 y.o. female who [facility] for follow-up of mood as well as to discuss lab results.  Mood: -Currently, she is doing well. 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 She  recently had to put her dog down, but got a new one (pitbull).   -She is currently on effexor 75mg  and wants to wean off this. She is in a good place in her life right now and has been thinking about this for a  while. She has had a counselor in the past. She has a good support system with her husband and her old friends in [place].   Blood sugar: she states she had an episode of low blood sugar recently and had some difficulty raising it.  She is also drinking a lot of water, as well as coffee with less sugar. She drinks almond milk.   She was taking dayquil for a few weeks before her blood work.   Depression screen [facility] 2/9 01/21/2018  Decreased Interest 0  Down, Depressed, Hopeless 0  PHQ - 2 Score 0  Altered sleeping 0  Tired, decreased energy 1  Change in appetite 0  Feeling bad or failure about yourself  0  Trouble concentrating 0  Moving slowly or fidgety/restless 0  Suicidal thoughts 0  PHQ-9 Score 1  Difficult doing work/chores Not difficult at all   No flowsheet data found.   Wt Readings from Last 3 Encounters:  01/21/18 156 lb 4.8 oz (70.9 kg)  06/16/17 162 lb (73.5 kg)   BP Readings from Last 3 Encounters:  01/21/18 134/85  06/16/17 133/81   Pulse Readings from Last 3 Encounters:  01/21/18 84  06/16/17 94   BMI Readings from Last 3 Encounters:  01/21/18 25.61 kg/m  06/16/17 26.55 kg/m   Patient Care Team    Relationship Specialty Notifications Start End  Thomasene Lot, DO PCP - General Family Medicine  06/16/17      Patient Active Problem List   Diagnosis Date Noted  . Counseling on health promotion and disease prevention 01/21/2018  . Nutritional counseling 01/21/2018  . Depression 06/16/2017  . Overweight (BMI 25.0-29.9) 06/16/2017  . Environmental and seasonal allergies 06/16/2017  . Family history of diabetes mellitus in mother 06/16/2017  . Family history of hypertension- mom 06/16/2017  . Family history of breast cancer- in M aunt in her early 91's 06/16/2017  . Pap smear of cervix shows high risk HPV present- in past, had colposcopy, 06/16/2017    Past Medical history, Surgical history, Family history, Social history, Allergies and Medications  have been entered into the medical record, reviewed and changed as needed.    Current Meds  Medication Sig  . [DISCONTINUED] venlafaxine (EFFEXOR) 75 MG tablet Take 1 tablet (75 mg total) by mouth daily. Office visit needed for further refills    Allergies:  Allergies  Allergen Reactions  . Penicillins Hives    Review of Systems: General:   No F/C, wt loss Pulm:   No DIB, SOB, pleuritic chest pain Card:  No CP, palpitations Abd:  No n/v/d or pain Ext:  No inc edema from baseline Psych: no SI/ HI   Objective:   Blood pressure 134/85, pulse 84, height 5' 5.5" (1.664 m), weight 156 lb 4.8 oz (70.9 kg), last menstrual period 12/31/2017, SpO2 99 %. Body mass index is 25.61 kg/m. General:  Well Developed, well nourished, appropriate for stated age.  Neuro:  Alert and oriented,  extra-ocular muscles intact  HEENT:  Normocephalic, atraumatic, neck supple, no carotid bruits appreciated  Skin:  no gross rash, warm, pink. Cardiac:  RRR, S1 S2 Respiratory:  ECTA B/L and A/P, Not using accessory muscles, speaking in full sentences- unlabored. Vascular:  Ext warm, no cyanosis apprec.; cap RF less 2 sec. Psych:  No HI/SI, judgement and insight good, Euthymic mood. Full Affect.

## 2018-01-21 NOTE — Patient Instructions (Addendum)
Yesenia Johnson please see when her last Pap smear was.  Patient has a history of abnormal and will need yearly for a couple of times at least   You are going to start with 37.5 tablets once daily from now for 2 weeks then go to one half of the 37.5 tablet for additional 2 weeks and then for 2 additional weeks take the half of the 37.5 every other day, but then you may even need to take it every third day for a couple of weeks and then go off  Your goal will be to try to do 30 minutes 5 days a week or at least slowly work your way up to it that can include a 15-minute in the morning and 15-minute in the afternoon    Please realize, EXERCISE IS MEDICINE!  -  American Heart Association [facility]( AHA) guidelines for exercise : If you are in good health, without any medical conditions, you should engage in 150 minutes of moderate intensity aerobic activity per week.  This means you should be huffing and puffing throughout your workout.   Engaging in regular exercise will improve brain function and memory, as well as improve mood, boost immune system and help with weight management.  As well as the other, more well-known effects of exercise such as decreasing blood sugar levels, decreasing blood pressure,  and decreasing bad cholesterol levels/ increasing good cholesterol levels.     -  The AHA strongly endorses consumption of a diet that contains a variety of foods from all the food categories with an emphasis on fruits and vegetables; fat-free and low-fat dairy products; cereal and grain products; legumes and nuts; and fish, poultry, and/or extra lean meats.    Excessive food intake, especially of foods high in saturated and trans fats, sugar, and salt, should be avoided.    Adequate water intake of roughly 1/2 of your weight in pounds, should equal the ounces of water per day you should drink.  So for instance, if you're 200 pounds, that would be 100 ounces of water per day.         Mediterranean Diet  Why follow  it? Research shows. . Those who follow the Mediterranean diet have a reduced risk of heart disease  . The diet is associated with a reduced incidence of Parkinson's and Alzheimer's diseases . People following the diet may have longer life expectancies and lower rates of chronic diseases  . The Dietary Guidelines for Americans recommends the Mediterranean diet as an eating plan to promote health and prevent disease  What Is the Mediterranean Diet?  . Healthy eating plan based on typical foods and recipes of Mediterranean-style cooking . The diet is primarily a plant based diet; these foods should make up a majority of meals   Starches - Plant based foods should make up a majority of meals - They are an important sources of vitamins, minerals, energy, antioxidants, and fiber - Choose whole grains, foods high in fiber and minimally processed items  - Typical grain sources include wheat, oats, barley, corn, brown rice, bulgar, farro, millet, polenta, couscous  - Various types of beans include chickpeas, lentils, fava beans, black beans, white beans   Fruits  Veggies - Large quantities of antioxidant rich fruits & veggies; 6 or more servings  - Vegetables can be eaten raw or lightly drizzled with oil and cooked  - Vegetables common to the traditional Mediterranean Diet include: artichokes, arugula, beets, broccoli, brussel sprouts, cabbage, carrots, celery, collard greens,  cucumbers, eggplant, kale, leeks, lemons, lettuce, mushrooms, okra, onions, peas, peppers, potatoes, pumpkin, radishes, rutabaga, shallots, spinach, sweet potatoes, turnips, zucchini - Fruits common to the Mediterranean Diet include: apples, apricots, avocados, cherries, clementines, dates, figs, grapefruits, grapes, melons, nectarines, oranges, peaches, pears, pomegranates, strawberries, tangerines  Fats - Replace butter and margarine with healthy oils, such as olive oil, canola oil, and tahini  - Limit nuts to no more than a  handful a day  - Nuts include walnuts, almonds, pecans, pistachios, pine nuts  - Limit or avoid candied, honey roasted or heavily salted nuts - Olives are central to the [employer] - can be eaten whole or used in a variety of dishes   Meats Protein - Limiting red meat: no more than a few times a month - When eating red meat: choose lean cuts and keep the portion to the size of deck of cards - Eggs: approx. 0 to 4 times a week  - Fish and lean poultry: at least 2 a week  - Healthy protein sources include, chicken, [place], lean beef, lamb - Increase intake of seafood such as tuna, salmon, trout, mackerel, shrimp, scallops - Avoid or limit high fat processed meats such as sausage and bacon  Dairy - Include moderate amounts of low fat dairy products  - Focus on healthy dairy such as fat free yogurt, skim milk, low or reduced fat cheese - Limit dairy products higher in fat such as whole or 2% milk, cheese, ice cream  Alcohol - Moderate amounts of red wine is ok  - No more than 5 oz daily for women (all ages) and men older than age 9  - No more than 10 oz of wine daily for men younger than 88  Other - Limit sweets and other desserts  - Use herbs and spices instead of salt to flavor foods  - Herbs and spices common to the traditional Mediterranean Diet include: basil, bay leaves, chives, cloves, cumin, fennel, garlic, lavender, marjoram, mint, oregano, parsley, pepper, rosemary, sage, savory, sumac, tarragon, thyme   It's not just a diet, it's a lifestyle:  . The Mediterranean diet includes lifestyle factors typical of those in the region  . Foods, drinks and meals are best eaten with others and savored . Daily physical activity is important for overall good health . This could be strenuous exercise like running and aerobics . This could also be more leisurely activities such as walking, housework, yard-work, or taking the stairs . Moderation is the key; a balanced and healthy diet  accommodates most foods and drinks . Consider portion sizes and frequency of consumption of certain foods   Meal Ideas & Options:  . Breakfast:  o Whole wheat toast or whole wheat English muffins with peanut butter & hard boiled egg o Steel cut oats topped with apples & cinnamon and skim milk  o Fresh fruit: banana, strawberries, melon, berries, peaches  o Smoothies: strawberries, bananas, greek yogurt, peanut butter o Low fat greek yogurt with blueberries and granola  o Egg white omelet with spinach and mushrooms o Breakfast couscous: whole wheat couscous, apricots, skim milk, cranberries  . Sandwiches:  o Hummus and grilled vegetables (peppers, zucchini, squash) on whole wheat bread   o Grilled chicken on whole wheat pita with lettuce, tomatoes, cucumbers or tzatziki  o Tuna salad on whole wheat bread: tuna salad made with greek yogurt, olives, red peppers, capers, green onions o Garlic rosemary lamb pita: lamb sauted with garlic, rosemary, salt & pepper;  add lettuce, cucumber, greek yogurt to pita - flavor with lemon juice and black pepper  . Seafood:  o Mediterranean grilled salmon, seasoned with garlic, basil, parsley, lemon juice and black pepper o Shrimp, lemon, and spinach whole-grain pasta salad made with low fat greek yogurt  o Seared scallops with lemon orzo  o Seared tuna steaks seasoned salt, pepper, coriander topped with tomato mixture of olives, tomatoes, olive oil, minced garlic, parsley, green onions and cappers  . Meats:  o Herbed greek chicken salad with kalamata olives, cucumber, feta  o Red bell peppers stuffed with spinach, bulgur, lean ground beef (or lentils) & topped with feta   o Kebabs: skewers of chicken, tomatoes, onions, zucchini, squash  o [place] burgers: made with red onions, mint, dill, lemon juice, feta cheese topped with roasted red peppers . Vegetarian o Cucumber salad: cucumbers, artichoke hearts, celery, red onion, feta cheese, tossed in olive oil &  lemon juice  o Hummus and whole grain pita points with a greek salad (lettuce, tomato, feta, olives, cucumbers, red onion) o Lentil soup with celery, carrots made with vegetable broth, garlic, salt and pepper  o Tabouli salad: parsley, bulgur, mint, scallions, cucumbers, tomato, radishes, lemon juice, olive oil, salt and pepper.    Lose it app or my fitness pal     Guidelines for a Low Cholesterol, Low Saturated Fat Diet  Fats - Limit total intake of fats and oils. - Avoid butter, stick margarine, shortening, lard, palm and coconut oils. - Limit mayonnaise, salad dressings, gravies and sauces, unless they are homemade with low-fat ingredients. - Limit chocolate. - Choose low-fat and nonfat products, such as low-fat mayonnaise, low-fat or non-hydrogenated peanut butter, low-fat or fat-free salad dressings and nonfat gravy. - Use vegetable oil, such as canola or olive oil. - Look for margarine that does not contain trans fatty acids. - Use nuts in moderate amounts. - Read ingredient labels carefully to determine both amount and type of fat present in foods. Limit saturated and trans fats! - Avoid high-fat processed and convenience foods.  Meats and Meat Alternatives - Choose fish, chicken, [place] and lean meats. - Use dried beans, peas, lentils and tofu. - Limit egg yolks to three to four per week. - If you eat red meat, limit to no more than three servings per week and choose loin or round cuts. - Avoid fatty meats, such as bacon, sausage, franks, luncheon meats and ribs. - Avoid all organ meats, including liver.  Dairy - Choose nonfat or low-fat milk, yogurt and cottage cheese. - Most cheeses are high in fat. Choose cheeses made from non-fat milk, such as mozzarella and ricotta cheese. - Choose light or fat-free cream cheese and sour cream. - Avoid cream and sauces made with cream.  Fruits and Vegetables - Eat a wide variety of fruits and vegetables. - Use lemon juice,  vinegar or "mist" olive oil on vegetables. - Avoid adding sauces, fat or oil to vegetables.  Breads, Cereals and Grains - Choose whole-grain breads, cereals, pastas and rice. - Avoid high-fat snack foods, such as granola, cookies, pies, pastries, doughnuts and croissants.  Cooking Tips - Avoid deep fried foods. - Trim visible fat off meats and remove skin from poultry before cooking. - Bake, broil, boil, poach or roast poultry, fish and lean meats. - Drain and discard fat that drains out of meat as you cook it. - Add little or no fat to foods. - Use vegetable oil sprays to grease pans for cooking  or baking. - Steam vegetables. - Use herbs or no-oil marinades to flavor foods.

## 2018-03-02 ENCOUNTER — Encounter: Payer: Self-pay | Admitting: Family Medicine

## 2018-03-02 ENCOUNTER — Ambulatory Visit (INDEPENDENT_AMBULATORY_CARE_PROVIDER_SITE_OTHER): Payer: BLUE CROSS/BLUE SHIELD | Admitting: Family Medicine

## 2018-03-02 VITALS — BP 123/77 | HR 76 | Ht 65.5 in | Wt 155.0 lb

## 2018-03-02 DIAGNOSIS — S40022A Contusion of left upper arm, initial encounter: Secondary | ICD-10-CM | POA: Diagnosis not present

## 2018-03-02 DIAGNOSIS — F329 Major depressive disorder, single episode, unspecified: Secondary | ICD-10-CM | POA: Diagnosis not present

## 2018-03-02 DIAGNOSIS — Z7189 Other specified counseling: Secondary | ICD-10-CM | POA: Diagnosis not present

## 2018-03-02 DIAGNOSIS — F32A Depression, unspecified: Secondary | ICD-10-CM

## 2018-03-02 NOTE — Progress Notes (Signed)
Pt here for an acute care OV today   Impression and Recommendations:    1. Contusion of left upper arm, initial encounter- with nodule/ scar tissue   2. Depression, unspecified depression type   3. Counseling on health promotion and disease prevention     1. Left Arm Nodule - Reviewed the healing process for bruises and related trauma with the patient.  Advised the patient that calcifications can occur with trauma, and may never go away, but that these should not change with time.  - Advised the patient to monitor the nodules by palpating the area once every week or so, as it should not be growing or getting bigger.  She should not touch it every day.  - Discussed that if the area is not red, exuding pus, infected, or sore, all should be well.  - As the patient is distressed about the region, if she would like to return in a month for follow-up, she can have the area palpated here again.  Encouraged the patient to keep an eye on it in the meantime.  2. Depression - Patient will continue her medication and monitor her symptoms.  Patient continues taking half tab of 37.5 Effexor per day.  Patient will continue to wean as she feels appropriate, with wishes to be off of it.  - Follow-up in 3 months to check on mood.   Education and routine counseling performed. Handouts provided  Gross side effects, risk and benefits, and alternatives of medications and treatment plan in general discussed with patient.  Patient is aware that all medications have potential side effects and we are unable to predict every side effect or drug-drug interaction that may occur.   Patient will call with any questions prior to using medication if they have concerns.  Expresses verbal understanding and consents to current therapy and treatment regimen.  No barriers to understanding were identified.  Red flag symptoms and signs discussed in detail.  Patient expressed understanding regarding what to do in case of  emergency\urgent symptoms   Please see AVS handed out to patient at the end of our visit for further patient instructions/ counseling done pertaining to today's office visit.   Return for 3 mo or so for reck mood, healthy habits.     Note: This document was prepared occasionally using Dragon voice recognition software and may include unintentional dictation errors in addition to a scribe.  This document serves as a record of services personally performed by Thomasene Lot, DO. It was created on her behalf by Peggye Fothergill, a trained medical scribe. The creation of this record is based on the scribe's personal observations and the provider's statements to them.   I have reviewed the above medical documentation for accuracy and completeness and I concur.  Thomasene Lot 03/02/18 11:06 AM   -------------------------------------------------------------------------------------------------------------------------------------------------------------    Subjective:    CC:  Chief Complaint  Patient presents with  . Mass    bump on left upper arm increasing in size    HPI: Yesenia Johnson is a 32 y.o. female who presents to [facility] today for issues as discussed below.  Left Arm Nodule Patient's last visit here was on 2/28.  At that time, a spot under her left arm was tender, maybe a bruise.  Patient notes "I get bruises all the time."  A week or two later, it still hurt, and she felt one "tiny bump" under the skin which was still hurting.  She kept an eye on it, and notes it kept growing over time (the past ~6.5 weeks), and that "now it's a line of bumps."  Notes that the area is not sore anymore unless she "really presses on it" now.  It was sore prior, but for the past 1-2 days, the soreness has been relieved  The patient is concerned about the possibility of cancer and visibly emotionally distressed about this concern in the midst of the  appointment.  She is consolable and when advised about the nature of her arm nodule, seems relieved.  Depression Patient continues taking half tab of 37.5 Effexor per day.  Patient will continue to wean as she feels appropriate, with wishes to be off of it.   No problems updated.   [facility] 03/02/2018 01/21/2018  Decreased Interest 0 0  Down, Depressed, Hopeless 0 0  PHQ - 2 Score 0 0  Altered sleeping 1 0  Tired, decreased energy 1 1  Change in appetite 0 0  Feeling bad or failure about yourself  0 0  Trouble concentrating 0 0  Moving slowly or fidgety/restless 0 0  Suicidal thoughts 0 0  PHQ-9 Score 2 1  Difficult doing work/chores Not difficult at all Not difficult at all    Wt Readings from Last 3 Encounters:  03/02/18 155 lb (70.3 kg)  01/21/18 156 lb 4.8 oz (70.9 kg)  06/16/17 162 lb (73.5 kg)   BP Readings from Last 3 Encounters:  03/02/18 123/77  01/21/18 134/85  06/16/17 133/81   BMI Readings from Last 3 Encounters:  03/02/18 25.40 kg/m  01/21/18 25.61 kg/m  06/16/17 26.55 kg/m     Patient Care Team    Relationship Specialty Notifications Start End  Thomasene Lotpalski, Zinia Innocent, DO PCP - General Family Medicine  06/16/17      Patient Active Problem List   Diagnosis Date Noted  . Counseling on health promotion and disease prevention 01/21/2018  . Nutritional counseling 01/21/2018  . Depression 06/16/2017  . Overweight (BMI 25.0-29.9) 06/16/2017  . Environmental and seasonal allergies 06/16/2017  . Family history of diabetes mellitus in mother 06/16/2017  . Family history of hypertension- mom 06/16/2017  . Family history of breast cancer- in M aunt in her early 6760's 06/16/2017  . Pap smear of cervix shows high risk HPV present- in past, had colposcopy, 06/16/2017    Past Medical history, Surgical history, Family history, Social history, Allergies and Medications have been entered into the medical record, reviewed and changed as needed.    No  outpatient medications have been marked as taking for the 03/02/18 encounter (Office Visit) with Thomasene Lotpalski, Candia Kingsbury, DO.    Allergies:  Allergies  Allergen Reactions  . Penicillins Hives     Review of Systems: General:   Denies fever, chills, unexplained weight loss.  Optho/Auditory:   Denies visual changes, blurred vision/LOV Respiratory:   Denies wheeze, DOE more than baseline levels.  Cardiovascular:   Denies chest pain, palpitations, new onset peripheral edema  Gastrointestinal:   Denies nausea, vomiting, diarrhea, abd pain.  Genitourinary: Denies dysuria, freq/ urgency, flank pain or discharge from genitals.  Endocrine:     Denies hot or cold intolerance, polyuria, polydipsia. Musculoskeletal:   Denies unexplained myalgias, joint swelling, unexplained arthralgias, gait problems.  Skin:  "Bumps" on left upper arm resulting from prior bruise.  Denies new onset rash, other suspicious lesions Neurological:     Denies dizziness, unexplained weakness, numbness  Psychiatric/Behavioral:   Denies mood changes, suicidal or  homicidal ideations, hallucinations    Objective:   Blood pressure 123/77, pulse 76, height 5' 5.5" (1.664 m), weight 155 lb (70.3 kg), last menstrual period 02/16/2018. Body mass index is 25.4 kg/m. General:  Well Developed, well nourished, appropriate for stated age.  Neuro:  Alert and oriented,  extra-ocular muscles intact  HEENT:  Normocephalic, atraumatic, neck supple Skin:  no gross rash, warm, pink. Three tiny hard nodules approximately 1 inch deep into skin lateral aspect of upper left arm.  Freely mobile.  No erythema, no swelling, no signs of acute inflammation. Cardiac:  RRR, S1 S2 Respiratory:  ECTA B/L and A/P, Not using accessory muscles, speaking in full sentences- unlabored. Vascular:  Ext warm, no cyanosis apprec.; cap RF less 2 sec. Psych:  No HI/SI, judgement and insight good, Euthymic mood. Full Affect.

## 2018-03-02 NOTE — Patient Instructions (Signed)
Please continue to monitor the area of former trauma as a think this is scar tissue and some calcification from the old bruise and trauma to your upper arm.  It should remain stable and not enlarged.  Please monitor it approximately once every week or so just to ensure it is not changing much.  Please follow-up sooner than planned if you have any other questions or concerns.

## 2018-03-24 ENCOUNTER — Other Ambulatory Visit: Payer: Self-pay

## 2018-03-24 DIAGNOSIS — F32A Depression, unspecified: Secondary | ICD-10-CM

## 2018-03-24 DIAGNOSIS — F329 Major depressive disorder, single episode, unspecified: Secondary | ICD-10-CM

## 2018-03-24 MED ORDER — VENLAFAXINE HCL 37.5 MG PO TABS: 18.7500 mg | ORAL_TABLET | Freq: Every day | ORAL | 0 refills | Status: DC

## 2018-03-24 NOTE — Telephone Encounter (Signed)
Pharmacy sent refill request and sent refill in based on current dosage noted in last office visit note. MPulliam, CMA/RT(R)

## 2018-04-12 ENCOUNTER — Telehealth: Payer: Self-pay | Admitting: Family Medicine

## 2018-04-12 NOTE — Telephone Encounter (Signed)
[place]  Rx refill on:   venlafaxine (EFFEXOR) 37.5 MG tablet [[id number]]   Order Details  Dose: 18.75 mg Route: Oral Frequency: Daily  Dispense Quantity: 90 tablet Refills: 0 Fills remaining: --        Sig: Take 0.5 tablets (18.75 mg total) by mouth daily.     -----Patient states 1/2 pill is not working & request increase of dosage to whole tab-- patient is also out of medication.   Please send to:  Preferred Pharmacies      [id number] - [place], [place] - [address] AT [facility] OF [address] & [address] [phone] (Phone) [phone] (Fax)   ----Forwarding request to medical assistant.  --Fausto Skillern

## 2018-04-12 NOTE — Telephone Encounter (Signed)
Patient was last seen on 03/02/18 and medication was last filled on  03/24/18 for #90.  Patient has appointment on 05/20/2018. Patient states that 1/2 tablet was not working she increased to 1 tablet a day like she had done in the past that worked well for her and she is tolerating this dose well.  Pharmacy only filled 15 tablets for her on 03/24/2018 due to insurance needing RX to say 1 tab daily.  Please advise if new RX can be sent for change in dose. MPulliam, CMA/RT(R)

## 2018-04-13 ENCOUNTER — Other Ambulatory Visit: Payer: Self-pay

## 2018-04-13 DIAGNOSIS — F32A Depression, unspecified: Secondary | ICD-10-CM

## 2018-04-13 DIAGNOSIS — F329 Major depressive disorder, single episode, unspecified: Secondary | ICD-10-CM

## 2018-04-13 MED ORDER — VENLAFAXINE HCL 37.5 MG PO TABS: 37.5000 mg | ORAL_TABLET | Freq: Every day | ORAL | 0 refills | Status: DC

## 2018-04-13 NOTE — Telephone Encounter (Signed)
Spoke to patient, she states that no acute changes she just didn't feel like the 1/2 tablets was working and could feel her symptoms getting worse.  Since she start back at the 1 tablet daily patient has been doing much better and is tolerating medication well. Medication sent in as instructed.   Patient will follow in the office before further refills of the medication.

## 2018-04-13 NOTE — Telephone Encounter (Signed)
Sent in medication with increase dose per Dr. Sharee Holster.  Please see pervious note in chart. MPulliam, CMA/RT(R)

## 2018-04-13 NOTE — Telephone Encounter (Signed)
Perfect, thnx Melissa

## 2018-04-13 NOTE — Telephone Encounter (Signed)
Last office visit patient stated she was doing fine and wanted to wean down off of the medicine.  Did anything acutely happen and does she need to see me to discuss acute stressors in her life right now (since this is a large deviation from the last time I saw her)?   -Okay to send in new prescription for Effexor taking 1 tablet daily instead of the half.  Please dispense 90 and no refill.  Will need office visit prior to refill.

## 2018-05-20 ENCOUNTER — Encounter: Payer: Self-pay | Admitting: Family Medicine

## 2018-05-20 ENCOUNTER — Ambulatory Visit (INDEPENDENT_AMBULATORY_CARE_PROVIDER_SITE_OTHER): Payer: BLUE CROSS/BLUE SHIELD | Admitting: Family Medicine

## 2018-05-20 VITALS — BP 138/88 | HR 71 | Ht 66.0 in | Wt 158.3 lb

## 2018-05-20 DIAGNOSIS — F329 Major depressive disorder, single episode, unspecified: Secondary | ICD-10-CM

## 2018-05-20 DIAGNOSIS — F32A Depression, unspecified: Secondary | ICD-10-CM

## 2018-05-20 DIAGNOSIS — Z7189 Other specified counseling: Secondary | ICD-10-CM | POA: Diagnosis not present

## 2018-05-20 MED ORDER — VENLAFAXINE HCL 37.5 MG PO TABS: 37.5000 mg | ORAL_TABLET | Freq: Every day | ORAL | 3 refills | Status: DC

## 2018-05-20 NOTE — Patient Instructions (Signed)
Living With Depression Everyone experiences occasional disappointment, sadness, and loss in their lives. When you are feeling down, blue, or sad for at least 2 weeks in a row, it may mean that you have depression. Depression can affect your thoughts and feelings, relationships, daily activities, and physical health. It is caused by changes in the way your brain functions. If you receive a diagnosis of depression, your health care provider will tell you which type of depression you have and what treatment options are available to you. If you are living with depression, there are ways to help you recover from it and also ways to prevent it from coming back. How to cope with lifestyle changes Coping with stress Stress is your body's reaction to life changes and events, both good and bad. Stressful situations may include:  Getting married.  The death of a spouse.  Losing a job.  Retiring.  Having a baby.  Stress can last just a few hours or it can be ongoing. Stress can play a major role in depression, so it is important to learn both how to cope with stress and how to think about it differently. Talk with your health care provider or a counselor if you would like to learn more about stress reduction. He or she may suggest some stress reduction techniques, such as:  Music therapy. This can include creating music or listening to music. Choose music that you enjoy and that inspires you.  Mindfulness-based meditation. This kind of meditation can be done while sitting or walking. It involves being aware of your normal breaths, rather than trying to control your breathing.  Centering prayer. This is a kind of meditation that involves focusing on a spiritual word or phrase. Choose a word, phrase, or sacred image that is meaningful to you and that brings you peace.  Deep breathing. To do this, expand your stomach and inhale slowly through your nose. Hold your breath for 3-5 seconds, then exhale  slowly, allowing your stomach muscles to relax.  Muscle relaxation. This involves intentionally tensing muscles then relaxing them.  Choose a stress reduction technique that fits your lifestyle and personality. Stress reduction techniques take time and practice to develop. Set aside 5-15 minutes a day to do them. Therapists can offer training in these techniques. The training may be covered by some insurance plans. Other things you can do to manage stress include:  Keeping a stress diary. This can help you learn what triggers your stress and ways to control your response.  Understanding what your limits are and saying no to requests or events that lead to a schedule that is too full.  Thinking about how you respond to certain situations. You may not be able to control everything, but you can control how you react.  Adding humor to your life by watching funny films or TV shows.  Making time for activities that help you relax and not feeling guilty about spending your time this way.  Medicines Your health care provider may suggest certain medicines if he or she feels that they will help improve your condition. Avoid using alcohol and other substances that may prevent your medicines from working properly (may interact). It is also important to:  Talk with your pharmacist or health care provider about all the medicines that you take, their possible side effects, and what medicines are safe to take together.  Make it your goal to take part in all treatment decisions (shared decision-making). This includes giving input on the side   effects of medicines. It is best if shared decision-making with your health care provider is part of your total treatment plan.  If your health care provider prescribes a medicine, you may not notice the full benefits of it for 4-8 weeks. Most people who are treated for depression need to be on medicine for at least 6-12 months after they feel better. If you are taking  medicines as part of your treatment, do not stop taking medicines without first talking to your health care provider. You may need to have the medicine slowly decreased (tapered) over time to decrease the risk of harmful side effects. Relationships Your health care provider may suggest family therapy along with individual therapy and drug therapy. While there may not be family problems that are causing you to feel depressed, it is still important to make sure your family learns as much as they can about your mental health. Having your family's support can help make your treatment successful. How to recognize changes in your condition Everyone has a different response to treatment for depression. Recovery from major depression happens when you have not had signs of major depression for two months. This may mean that you will start to:  Have more interest in doing activities.  Feel less hopeless than you did 2 months ago.  Have more energy.  Overeat less often, or have better or improving appetite.  Have better concentration.  Your health care provider will work with you to decide the next steps in your recovery. It is also important to recognize when your condition is getting worse. Watch for these signs:  Having fatigue or low energy.  Eating too much or too little.  Sleeping too much or too little.  Feeling restless, agitated, or hopeless.  Having trouble concentrating or making decisions.  Having unexplained physical complaints.  Feeling irritable, angry, or aggressive.  Get help as soon as you or your family members notice these symptoms coming back. How to get support and help from others How to talk with friends and family members about your condition Talking to friends and family members about your condition can provide you with one way to get support and guidance. Reach out to trusted friends or family members, explain your symptoms to them, and let them know that you are  working with a health care provider to treat your depression. Financial resources Not all insurance plans cover mental health care, so it is important to check with your insurance carrier. If paying for co-pays or counseling services is a problem, search for a local or county mental health care center. They may be able to offer public mental health care services at low or no cost when you are not able to see a private health care provider. If you are taking medicine for depression, you may be able to get the generic form, which may be less expensive. Some makers of prescription medicines also offer help to patients who cannot afford the medicines they need. Follow these instructions at home:  Get the right amount and quality of sleep.  Cut down on using caffeine, tobacco, alcohol, and other potentially harmful substances.  Try to exercise, such as walking or lifting small weights.  Take over-the-counter and prescription medicines only as told by your health care provider.  Eat a healthy diet that includes plenty of vegetables, fruits, whole grains, low-fat dairy products, and lean protein. Do not eat a lot of foods that are high in solid fats, added sugars, or salt.    Keep all follow-up visits as told by your health care provider. This is important. Contact a health care provider if:  You stop taking your antidepressant medicines, and you have any of these symptoms: ? Nausea. ? Headache. ? Feeling lightheaded. ? Chills and body aches. ? Not being able to sleep (insomnia).  You or your friends and family think your depression is getting worse. Get help right away if:  You have thoughts of hurting yourself or others. If you ever feel like you may hurt yourself or others, or have thoughts about taking your own life, get help right away. You can go to your nearest emergency department or call:  Your local emergency services (911 in the [place]  A suicide crisis helpline, such as the  National Suicide Prevention Lifeline at 1-800-273-8255. This is open 24-hours a day.  Summary  If you are living with depression, there are ways to help you recover from it and also ways to prevent it from coming back.  Work with your health care team to create a management plan that includes counseling, stress management techniques, and healthy lifestyle habits. This information is not intended to replace advice given to you by your health care provider. Make sure you discuss any questions you have with your health care provider. Document Released: 10/13/2016 Document Revised: 10/13/2016 Document Reviewed: 10/13/2016 Elsevier Interactive Patient Education  2018 Elsevier Inc.  

## 2018-05-20 NOTE — Progress Notes (Signed)
Impression and Recommendations:    1. Depression, unspecified depression type   2. Counseling on health promotion and disease prevention     1. Depression -mood is stable. Continue effexor dose as listed below. -refill given.   2. Overweight/counseling on health -encouraged continue prudent diet/exercise. -decrease salt intake, increase water intake. -Goal: 30-40 minutes of cardio a day.    -Education and routine counseling performed. Handouts provided.  No orders of the defined types were placed in this encounter.   Meds ordered this encounter  Medications  . venlafaxine (EFFEXOR) 37.5 MG tablet    Sig: Take 1 tablet (37.5 mg total) by mouth daily.    Dispense:  90 tablet    Refill:  3    Gross side effects, risk and benefits, and alternatives of medications and treatment plan in general discussed with patient.  Patient is aware that all medications have potential side effects and we are unable to predict every side effect or drug-drug interaction that may occur.   Patient will call with any questions prior to using medication if they have concerns.  Expresses verbal understanding and consents to current therapy and treatment regimen.  No barriers to understanding were identified.  Red flag symptoms and signs discussed in detail.  Patient expressed understanding regarding what to do in case of emergency\urgent symptoms.  Please see AVS handed out to patient at the end of our visit for further patient instructions/ counseling done pertaining to today's office visit.   Return for CPE/ yrly physical near future, come fasting.     Note: This document was prepared using Dragon voice recognition software and may include unintentional dictation errors.  This document serves as a record of services personally performed by Thomasene Loteborah Kielan Dreisbach, DO. It was created on her behalf by Thelma BargeNick Cochran, a trained medical scribe. The creation of this record is based on the scribe's personal  observations and the provider's statements to them.   I have reviewed the above medical documentation for accuracy and completeness and I concur.  Thomasene LotDeborah Rosaleah Person 05/23/18 11:39 PM  --------------------------------------------------------------------------------------------------------------------------------------------------------------------------------------------------------------------------------------------    Subjective:    CC:  Chief Complaint  Patient presents with  . Follow-up    HPI: Yesenia Johnson is a 32 y.o. female who [facility]-up of mood.   She has stresses with her family- her mom is moving away. Her brother had his gallbladder removed yesterday.   She is exercising more. Recently, she has been doing yard work, but fell off this due to the hot weather. She started walking her dog twice a week.   Mood She is sleeping well. Her effexor dose is working well for her now. She is realizing she probably has to be on effexor for the rest of her life because when she went off/reduced her dose, her mood was "all over the place" and she was tearful at times, etc.   Dep[facility] 05/20/2018 05/20/2018 03/02/2018  Decreased Interest 0 0 0  Down, Depressed, Hopeless 0 0 0  PHQ - 2 Score 0 0 0  Altered sleeping 0 - 1  Tired, decreased energy 1 - 1  Change in appetite 0 - 0  Feeling bad or failure about yourself  0 - 0  Trouble concentrating 0 - 0  Moving slowly or fidgety/restless 0 - 0  Suicidal thoughts 0 - 0  PHQ-9 Score 1 - 2  Difficult doing work/chores - -  Not difficult at all     No flowsheet data found.   Wt Readings from Last 3 Encounters:  05/20/18 158 lb 4.8 oz (71.8 kg)  03/02/18 155 lb (70.3 kg)  01/21/18 156 lb 4.8 oz (70.9 kg)   BP Readings from Last 3 Encounters:  05/20/18 138/88  03/02/18 123/77  01/21/18 134/85   Pulse Readings from Last 3 Encounters:  05/20/18 71    03/02/18 76  01/21/18 84   BMI Readings from Last 3 Encounters:  05/20/18 25.55 kg/m  03/02/18 25.40 kg/m  01/21/18 25.61 kg/m         Patient Care Team    Relationship Specialty Notifications Start End  Thomasene Lot, DO PCP - General Family Medicine  06/16/17      Patient Active Problem List   Diagnosis Date Noted  . Counseling on health promotion and disease prevention 01/21/2018  . Nutritional counseling 01/21/2018  . Depression 06/16/2017  . Overweight (BMI 25.0-29.9) 06/16/2017  . Environmental and seasonal allergies 06/16/2017  . Family history of diabetes mellitus in mother 06/16/2017  . Family history of hypertension- mom 06/16/2017  . Family history of breast cancer- in M aunt in her early 57's 06/16/2017  . Pap smear of cervix shows high risk HPV present- in past, had colposcopy, 06/16/2017    Past Medical history, Surgical history, Family history, Social history, Allergies and Medications have been entered into the medical record, reviewed and changed as needed.    Current Meds  Medication Sig  . venlafaxine (EFFEXOR) 37.5 MG tablet Take 1 tablet (37.5 mg total) by mouth daily.  . [DISCONTINUED] venlafaxine (EFFEXOR) 37.5 MG tablet Take 1 tablet (37.5 mg total) by mouth daily.    Allergies:  Allergies  Allergen Reactions  . Penicillins Hives     Review of Systems: Review of Systems: General:   No F/C, wt loss Pulm:   No DIB, SOB, pleuritic chest pain Card:  No CP, palpitations Abd:  No n/v/d or pain Ext:  No inc edema from baseline Psych: no SI/ HI    Objective:   Blood pressure 138/88, pulse 71, height 5\' 6"  (1.676 m), weight 158 lb 4.8 oz (71.8 kg), SpO2 99 %. Body mass index is 25.55 kg/m. General:  Well Developed, well nourished, appropriate for stated age.  Neuro:  Alert and oriented,  extra-ocular muscles intact  HEENT:  Normocephalic, atraumatic, neck supple, no carotid bruits appreciated  Skin:  no gross rash, warm,  pink. Cardiac:  RRR, S1 S2 Respiratory:  ECTA B/L and A/P, Not using accessory muscles, speaking in full sentences- unlabored. Vascular:  Ext warm, no cyanosis apprec.; cap RF less 2 sec. Psych:  No HI/SI, judgement and insight good, Euthymic mood. Full Affect.

## 2018-06-14 ENCOUNTER — Encounter: Payer: Self-pay | Admitting: Adult Health

## 2018-06-14 ENCOUNTER — Ambulatory Visit (INDEPENDENT_AMBULATORY_CARE_PROVIDER_SITE_OTHER): Payer: BLUE CROSS/BLUE SHIELD | Admitting: Adult Health

## 2018-06-14 VITALS — BP 118/75 | HR 68 | Temp 98.4°F | Ht 66.0 in | Wt 155.4 lb

## 2018-06-14 DIAGNOSIS — R300 Dysuria: Secondary | ICD-10-CM

## 2018-06-14 DIAGNOSIS — R8290 Unspecified abnormal findings in urine: Secondary | ICD-10-CM | POA: Diagnosis not present

## 2018-06-14 LAB — POCT URINALYSIS DIPSTICK
Bilirubin, UA: NEGATIVE
GLUCOSE UA: NEGATIVE
Ketones, UA: NEGATIVE
NITRITE UA: NEGATIVE
PROTEIN UA: NEGATIVE
SPEC GRAV UA: 1.015 (ref 1.010–1.025)
Urobilinogen, UA: 0.2 E.U./dL
pH, UA: 6 (ref 5.0–8.0)

## 2018-06-14 MED ORDER — NITROFURANTOIN MONOHYD MACRO 100 MG PO CAPS: 100.0000 mg | ORAL_CAPSULE | Freq: Two times a day (BID) | ORAL | 0 refills | Status: DC

## 2018-06-14 NOTE — Patient Instructions (Signed)
Urinary Tract Infection, Adult A urinary tract infection (UTI) is an infection of any part of the urinary tract. The urinary tract includes the:  Kidneys.  Ureters.  Bladder.  Urethra.  These organs make, store, and get rid of pee (urine) in the body. Follow these instructions at home:  Take over-the-counter and prescription medicines only as told by your doctor.  If you were prescribed an antibiotic medicine, take it as told by your doctor. Do not stop taking the antibiotic even if you start to feel better.  Avoid the following drinks: ? Alcohol. ? Caffeine. ? Tea. ? Carbonated drinks.  Drink enough fluid to keep your pee clear or pale yellow.  Keep all follow-up visits as told by your doctor. This is important.  Make sure to: ? Empty your bladder often and completely. Do not to hold pee for long periods of time. ? Empty your bladder before and after sex. ? Wipe from front to back after a bowel movement if you are female. Use each tissue one time when you wipe. Contact a doctor if:  You have back pain.  You have a fever.  You feel sick to your stomach (nauseous).  You throw up (vomit).  Your symptoms do not get better after 3 days.  Your symptoms go away and then come back. Get help right away if:  You have very bad back pain.  You have very bad lower belly (abdominal) pain.  You are throwing up and cannot keep down any medicines or water. This information is not intended to replace advice given to you by your health care provider. Make sure you discuss any questions you have with your health care provider. Document Released: 04/28/2008 Document Revised: 04/17/2016 Document Reviewed: 10/01/2015 Elsevier Interactive Patient Education  2018 [employer]  Continue to push fluids. Start Macrobid 100mg  twice daily, take for 7 days. We will call you when urine culture/sensitivty results are available. Please call clinic with any questions/concerns. FEEL  BETTER!

## 2018-06-14 NOTE — Progress Notes (Signed)
Subjective:    Patient ID: Yesenia Johnson, female    DOB: Dec 20, 1985, 32 y.o.   MRN: [id number]  HPI :  Yesenia Johnson presents with urinary frequency, dysuria, and bladder pressure that all began in Friday and has been steadily worsening. Yesenia Johnson also reports mild flank pain. Yesenia Johnson has been pushing fluids and tried "some sort of over the counter pain relief for UTI that did not work at all" Yesenia Johnson denies fever/night sweats/abdominal pain. Yesenia Johnson denies N/V/D  Patient Care Team    Relationship Specialty Notifications Start End  Thomasene Lot, DO PCP - General Family Medicine  06/16/17     Patient Active Problem List   Diagnosis Date Noted  . Dysuria 06/14/2018  . Abnormal urinalysis 06/14/2018  . Counseling on health promotion and disease prevention 01/21/2018  . Nutritional counseling 01/21/2018  . Depression 06/16/2017  . Overweight (BMI 25.0-29.9) 06/16/2017  . Environmental and seasonal allergies 06/16/2017  . Family history of diabetes mellitus in mother 06/16/2017  . Family history of hypertension- mom 06/16/2017  . Family history of breast cancer- in M aunt in her early 24's 06/16/2017  . Pap smear of cervix shows high risk HPV present- in past, had colposcopy, 06/16/2017     Past Medical History:  Diagnosis Date  . Depression      Past Surgical History:  Procedure Laterality Date  . WISDOM TOOTH EXTRACTION  11/2016     Family History  Problem Relation Age of Onset  . Hypertension Mother   . Diabetes Mother   . Stroke Maternal Grandmother   . Diabetes Paternal Grandmother   . Breast cancer Maternal Aunt   . Alcoholism Maternal Aunt   . Diabetes Paternal Aunt      Social History   Substance and Sexual Activity  Drug Use No     Social History   Substance and Sexual Activity  Alcohol Use Yes  . Alcohol/week: 1.2 oz  . Types: 2 Cans of beer per week     Social History   Tobacco Use  Smoking Status Never Smoker  Smokeless Tobacco Never Used      Outpatient Encounter Medications as of 06/14/2018  Medication Sig  . venlafaxine (EFFEXOR) 37.5 MG tablet Take 1 tablet (37.5 mg total) by mouth daily.  . nitrofurantoin, macrocrystal-monohydrate, (MACROBID) 100 MG capsule Take 1 capsule (100 mg total) by mouth 2 (two) times daily.   No facility-administered encounter medications on file as of 06/14/2018.     Allergies: Penicillins  Body mass index is 25.08 kg/m.  Blood pressure 118/75, pulse 68, temperature 98.4 F (36.9 C), temperature source Oral, height 5\' 6"  (1.676 m), weight 155 lb 6.4 oz (70.5 kg), last menstrual period 05/18/2018, SpO2 100 %.  Review of Systems  Constitutional: Negative for activity change, appetite change, chills, diaphoresis, fatigue, fever and unexpected weight change.  Eyes: Negative for visual disturbance.  Respiratory: Negative for cough, chest tightness, shortness of breath, wheezing and stridor.   Cardiovascular: Negative for chest pain, palpitations and leg swelling.  Gastrointestinal: Negative for abdominal distention, abdominal pain, blood in stool, constipation, diarrhea, nausea and vomiting.  Genitourinary: Positive for dysuria, flank pain and frequency. Negative for difficulty urinating, hematuria, menstrual problem, urgency and vaginal bleeding.  Neurological: Negative for dizziness and headaches.  Hematological: Does not bruise/bleed easily.       Objective:   Physical Exam  Constitutional: Yesenia Johnson is oriented to person, place, and time. Yesenia Johnson appears well-developed and well-nourished. No distress.  HENT:  Head: Normocephalic.  Right Ear: External ear normal.  Left Ear: External ear normal.  Nose: Nose normal.  Mouth/Throat: Oropharynx is clear and moist.  Cardiovascular: Normal rate, regular rhythm, normal heart sounds and intact distal pulses.  No murmur heard. Pulmonary/Chest: Effort normal and breath sounds normal. No stridor. No respiratory distress. Yesenia Johnson has no wheezes. Yesenia Johnson has no  rales. Yesenia Johnson exhibits no tenderness.  Abdominal: Soft. Bowel sounds are normal. Yesenia Johnson exhibits no distension and no mass. There is no tenderness. There is CVA tenderness. There is no rigidity, no rebound, no guarding, no tenderness at McBurney's point and negative Murphy's sign. No hernia.  Neurological: Yesenia Johnson is alert and oriented to person, place, and time.  Skin: Skin is warm and dry. Capillary refill takes less than 2 seconds. No rash noted. Yesenia Johnson is not diaphoretic. No erythema. No pallor.  Psychiatric: Yesenia Johnson has a normal mood and affect. Her behavior is normal. Judgment and thought content normal.  Nursing note and vitals reviewed.     Assessment & Plan:   1. Dysuria   2. Abnormal urinalysis     Abnormal urinalysis UA- BLO small LEu Large Sent for urine C/S Continue to push fluids. Start Macrobid 100mg  twice daily, take for 7 days.     FOLLOW-UP:  Return if symptoms worsen or fail to improve.

## 2018-06-14 NOTE — Assessment & Plan Note (Signed)
UA- BLO small LEu Large Sent for urine C/S Continue to push fluids. Start Macrobid 100mg  twice daily, take for 7 days.

## 2018-06-17 ENCOUNTER — Other Ambulatory Visit: Payer: Self-pay

## 2018-06-17 ENCOUNTER — Telehealth: Payer: Self-pay | Admitting: Family Medicine

## 2018-06-17 DIAGNOSIS — F329 Major depressive disorder, single episode, unspecified: Secondary | ICD-10-CM

## 2018-06-17 DIAGNOSIS — F32A Depression, unspecified: Secondary | ICD-10-CM

## 2018-06-17 LAB — CULTURE, URINE COMPREHENSIVE

## 2018-06-17 MED ORDER — VENLAFAXINE HCL 37.5 MG PO TABS: 37.5000 mg | ORAL_TABLET | Freq: Every day | ORAL | 1 refills | Status: DC

## 2018-06-17 NOTE — Telephone Encounter (Signed)
Patient called stating that her pharmacy insurance benefits have changed and now she must use [facility] for meds moving forward. She is needing her venlafaxine, so she called [facility] to have the prescription moved but is getting the run around that it was not listed as a 90 day supply for insurance to pay. I told patient that it was in fact written for 90 days with 3 refills and she agreed but can't seem to get the two pharmacy groups to realize this. So to bypass all the issues she is hoping for this med to just get reissued to [facility] if at all possible. Please advise

## 2018-06-17 NOTE — Telephone Encounter (Signed)
Patient is now using mail order pharmacy.  Sent in new RX for Effexor. MPulliam, CMA/RT(R)

## 2018-06-17 NOTE — Telephone Encounter (Signed)
Sent in new rx and patient notified. MPulliam, CMA/RT(R)  

## 2018-06-30 NOTE — Patient Instructions (Addendum)
Check out the FODMAP diet re: your GI issues.  Please realize, EXERCISE IS MEDICINE!  -  American Heart Association [facility]) guidelines for exercise : If you are in good health, without any medical conditions, you should engage in 150 minutes of moderate intensity aerobic activity per week.  This means you should be huffing and puffing throughout your workout.   Engaging in regular exercise will improve brain function and memory, as well as improve mood, boost immune system and help with weight management.  As well as the other, more well-known effects of exercise such as decreasing blood sugar levels, decreasing blood pressure,  and decreasing bad cholesterol levels/ increasing good cholesterol levels.     -  The AHA strongly endorses consumption of a diet that contains a variety of foods from all the food categories with an emphasis on fruits and vegetables; fat-free and low-fat dairy products; cereal and grain products; legumes and nuts; and fish, poultry, and/or extra lean meats.    Excessive food intake, especially of foods high in saturated and trans fats, sugar, and salt, should be avoided.    Adequate water intake of roughly 1/2 of your weight in pounds, should equal the ounces of water per day you should drink.  So for instance, if you're 200 pounds, that would be 100 ounces of water per day.         Mediterranean Diet  Why follow it? Research shows. . Those who follow the Mediterranean diet have a reduced risk of heart disease  . The diet is associated with a reduced incidence of Parkinson's and Alzheimer's diseases . People following the diet may have longer life expectancies and lower rates of chronic diseases  . The Dietary Guidelines for Americans recommends the Mediterranean diet as an eating plan to promote health and prevent disease  What Is the Mediterranean Diet?  . Healthy eating plan based on typical foods and recipes of Mediterranean-style cooking . The diet is primarily a  plant based diet; these foods should make up a majority of meals   Starches - Plant based foods should make up a majority of meals - They are an important sources of vitamins, minerals, energy, antioxidants, and fiber - Choose whole grains, foods high in fiber and minimally processed items  - Typical grain sources include wheat, oats, barley, corn, brown rice, bulgar, farro, millet, polenta, couscous  - Various types of beans include chickpeas, lentils, fava beans, black beans, white beans   Fruits  Veggies - Large quantities of antioxidant rich fruits & veggies; 6 or more servings  - Vegetables can be eaten raw or lightly drizzled with oil and cooked  - Vegetables common to the traditional Mediterranean Diet include: artichokes, arugula, beets, broccoli, brussel sprouts, cabbage, carrots, celery, collard greens, cucumbers, eggplant, kale, leeks, lemons, lettuce, mushrooms, okra, onions, peas, peppers, potatoes, pumpkin, radishes, rutabaga, shallots, spinach, sweet potatoes, turnips, zucchini - Fruits common to the Mediterranean Diet include: apples, apricots, avocados, cherries, clementines, dates, figs, grapefruits, grapes, melons, nectarines, oranges, peaches, pears, pomegranates, strawberries, tangerines  Fats - Replace butter and margarine with healthy oils, such as olive oil, canola oil, and tahini  - Limit nuts to no more than a handful a day  - Nuts include walnuts, almonds, pecans, pistachios, pine nuts  - Limit or avoid candied, honey roasted or heavily salted nuts - Olives are central to the Mediterranean diet - can be eaten whole or used in a variety of dishes   Meats Protein - Limiting  red meat: no more than a few times a month - When eating red meat: choose lean cuts and keep the portion to the size of deck of cards - Eggs: approx. 0 to 4 times a week  - Fish and lean poultry: at least 2 a week  - Healthy protein sources include, chicken, [place], lean beef, lamb - Increase intake  of seafood such as tuna, salmon, trout, mackerel, shrimp, scallops - Avoid or limit high fat processed meats such as sausage and bacon  Dairy - Include moderate amounts of low fat dairy products  - Focus on healthy dairy such as fat free yogurt, skim milk, low or reduced fat cheese - Limit dairy products higher in fat such as whole or 2% milk, cheese, ice cream  Alcohol - Moderate amounts of red wine is ok  - No more than 5 oz daily for women (all ages) and men older than age 14  - No more than 10 oz of wine daily for men younger than 76  Other - Limit sweets and other desserts  - Use herbs and spices instead of salt to flavor foods  - Herbs and spices common to the traditional Mediterranean Diet include: basil, bay leaves, chives, cloves, cumin, fennel, garlic, lavender, marjoram, mint, oregano, parsley, pepper, rosemary, sage, savory, sumac, tarragon, thyme   It's not just a diet, it's a lifestyle:  . The Mediterranean diet includes lifestyle factors typical of those in the region  . Foods, drinks and meals are best eaten with others and savored . Daily physical activity is important for overall good health . This could be strenuous exercise like running and aerobics . This could also be more leisurely activities such as walking, housework, yard-work, or taking the stairs . Moderation is the key; a balanced and healthy diet accommodates most foods and drinks . Consider portion sizes and frequency of consumption of certain foods   Meal Ideas & Options:  . Breakfast:  o Whole wheat toast or whole wheat English muffins with peanut butter & hard boiled egg o Steel cut oats topped with apples & cinnamon and skim milk  o Fresh fruit: banana, strawberries, melon, berries, peaches  o Smoothies: strawberries, bananas, greek yogurt, peanut butter o Low fat greek yogurt with blueberries and granola  o Egg white omelet with spinach and mushrooms o Breakfast couscous: whole wheat couscous,  apricots, skim milk, cranberries  . Sandwiches:  o Hummus and grilled vegetables (peppers, zucchini, squash) on whole wheat bread   o Grilled chicken on whole wheat pita with lettuce, tomatoes, cucumbers or tzatziki  o Tuna salad on whole wheat bread: tuna salad made with greek yogurt, olives, red peppers, capers, green onions o Garlic rosemary lamb pita: lamb sauted with garlic, rosemary, salt & pepper; add lettuce, cucumber, greek yogurt to pita - flavor with lemon juice and black pepper  . Seafood:  o Mediterranean grilled salmon, seasoned with garlic, basil, parsley, lemon juice and black pepper o Shrimp, lemon, and spinach whole-grain pasta salad made with low fat greek yogurt  o Seared scallops with lemon orzo  o Seared tuna steaks seasoned salt, pepper, coriander topped with tomato mixture of olives, tomatoes, olive oil, minced garlic, parsley, green onions and cappers  . Meats:  o Herbed greek chicken salad with kalamata olives, cucumber, feta  o Red bell peppers stuffed with spinach, bulgur, lean ground beef (or lentils) & topped with feta   o Kebabs: skewers of chicken, tomatoes, onions, zucchini, squash  o  [place] burgers: made with red onions, mint, dill, lemon juice, feta cheese topped with roasted red peppers . Vegetarian o Cucumber salad: cucumbers, artichoke hearts, celery, red onion, feta cheese, tossed in olive oil & lemon juice  o Hummus and whole grain pita points with a greek salad (lettuce, tomato, feta, olives, cucumbers, red onion) o Lentil soup with celery, carrots made with vegetable broth, garlic, salt and pepper  o Tabouli salad: parsley, bulgur, mint, scallions, cucumbers, tomato, radishes, lemon juice, olive oil, salt and pepper.    Preventive Care for Adults, Female  A healthy lifestyle and preventive care can promote health and wellness. Preventive health guidelines for women include the following key practices.   A routine yearly physical is a good way to  check with your health care provider about your health and preventive screening. It is a chance to share any concerns and updates on your health and to receive a thorough exam.   Visit your dentist for a routine exam and preventive care every 6 months. Brush your teeth twice a day and floss once a day. Good oral hygiene prevents tooth decay and gum disease.   The frequency of eye exams is based on your age, health, family medical history, use of contact lenses, and other factors. Follow your health care provider's recommendations for frequency of eye exams.   Eat a healthy diet. Foods like vegetables, fruits, whole grains, low-fat dairy products, and lean protein foods contain the nutrients you need without too many calories. Decrease your intake of foods high in solid fats, added sugars, and salt. Eat the right amount of calories for you.Get information about a proper diet from your health care provider, if necessary.   Regular physical exercise is one of the most important things you can do for your health. Most adults should get at least 150 minutes of moderate-intensity exercise (any activity that increases your heart rate and causes you to sweat) each week. In addition, most adults need muscle-strengthening exercises on 2 or more days a week.   Maintain a healthy weight. The body mass index (BMI) is a screening tool to identify possible weight problems. It provides an estimate of body fat based on height and weight. Your health care provider can find your BMI, and can help you achieve or maintain a healthy weight.For adults 20 years and older:   - A BMI below 18.5 is considered underweight.   - A BMI of 18.5 to 24.9 is normal.   - A BMI of 25 to 29.9 is considered overweight.   - A BMI of 30 and above is considered obese.   Maintain normal blood lipids and cholesterol levels by exercising and minimizing your intake of trans and saturated fats.  Eat a balanced diet with plenty of fruit  and vegetables. Blood tests for lipids and cholesterol should begin at age 95 and be repeated every 5 years minimum.  If your lipid or cholesterol levels are high, you are over 40, or you are at high risk for heart disease, you may need your cholesterol levels checked more frequently.Ongoing high lipid and cholesterol levels should be treated with medicines if diet and exercise are not working.   If you smoke, find out from your health care provider how to quit. If you do not use tobacco, do not start.   Lung cancer screening is recommended for adults aged 74-80 years who are at high risk for developing lung cancer because of a history of smoking. A yearly low-dose  CT scan of the lungs is recommended for people who have at least a 30-pack-year history of smoking and are a current smoker or have quit within the past 15 years. A pack year of smoking is smoking an average of 1 pack of cigarettes a day for 1 year (for example: 1 pack a day for 30 years or 2 packs a day for 15 years). Yearly screening should continue until the smoker has stopped smoking for at least 15 years. Yearly screening should be stopped for people who develop a health problem that would prevent them from having lung cancer treatment.   If you are pregnant, do not drink alcohol. If you are breastfeeding, be very cautious about drinking alcohol. If you are not pregnant and choose to drink alcohol, do not have more than 1 drink per day. One drink is considered to be 12 ounces (355 mL) of beer, 5 ounces (148 mL) of wine, or 1.5 ounces (44 mL) of liquor.   Avoid use of street drugs. Do not share needles with anyone. Ask for help if you need support or instructions about stopping the use of drugs.   High blood pressure causes heart disease and increases the risk of stroke. Your blood pressure should be checked at least yearly.  Ongoing high blood pressure should be treated with medicines if weight loss and exercise do not work.   If  you are 62-17 years old, ask your health care provider if you should take aspirin to prevent strokes.   Diabetes screening involves taking a blood sample to check your fasting blood sugar level. This should be done once every 3 years, after age 51, if you are within normal weight and without risk factors for diabetes. Testing should be considered at a younger age or be carried out more frequently if you are overweight and have at least 1 risk factor for diabetes.   Breast cancer screening is essential preventive care for women. You should practice "breast self-awareness."  This means understanding the normal appearance and feel of your breasts and may include breast self-examination.  Any changes detected, no matter how small, should be reported to a health care provider.  Women in their 19s and 30s should have a clinical breast exam (CBE) by a health care provider as part of a regular health exam every 1 to 3 years.  After age 45, women should have a CBE every year.  Starting at age 55, women should consider having a mammogram (breast X-ray test) every year.  Women who have a family history of breast cancer should talk to their health care provider about genetic screening.  Women at a high risk of breast cancer should talk to their health care providers about having an MRI and a mammogram every year.   -Breast cancer gene (BRCA)-related cancer risk assessment is recommended for women who have family members with BRCA-related cancers. BRCA-related cancers include breast, ovarian, tubal, and peritoneal cancers. Having family members with these cancers may be associated with an increased risk for harmful changes (mutations) in the breast cancer genes BRCA1 and BRCA2. Results of the assessment will determine the need for genetic counseling and BRCA1 and BRCA2 testing.   The Pap test is a screening test for cervical cancer. A Pap test can show cell changes on the cervix that might become cervical cancer if  left untreated. A Pap test is a procedure in which cells are obtained and examined from the lower end of the uterus (cervix).   -  Women should have a Pap test starting at age 47.   - Between ages 78 and 27, Pap tests should be repeated every 2 years.   - Beginning at age 49, you should have a Pap test every 3 years as long as the past 3 Pap tests have been normal.   - Some women have medical problems that increase the chance of getting cervical cancer. Talk to your health care provider about these problems. It is especially important to talk to your health care provider if a new problem develops soon after your last Pap test. In these cases, your health care provider may recommend more frequent screening and Pap tests.   - The above recommendations are the same for women who have or have not gotten the vaccine for human papillomavirus (HPV).   - If you had a hysterectomy for a problem that was not cancer or a condition that could lead to cancer, then you no longer need Pap tests. Even if you no longer need a Pap test, a regular exam is a good idea to make sure no other problems are starting.   - If you are between ages 43 and 67 years, and you have had normal Pap tests going back 10 years, you no longer need Pap tests. Even if you no longer need a Pap test, a regular exam is a good idea to make sure no other problems are starting.   - If you have had past treatment for cervical cancer or a condition that could lead to cancer, you need Pap tests and screening for cancer for at least 20 years after your treatment.   - If Pap tests have been discontinued, risk factors (such as a new sexual partner) need to be reassessed to determine if screening should be resumed.   - The HPV test is an additional test that may be used for cervical cancer screening. The HPV test looks for the virus that can cause the cell changes on the cervix. The cells collected during the Pap test can be tested for HPV. The HPV  test could be used to screen women aged 65 years and older, and should be used in women of any age who have unclear Pap test results. After the age of 69, women should have HPV testing at the same frequency as a Pap test.   Colorectal cancer can be detected and often prevented. Most routine colorectal cancer screening begins at the age of 20 years and continues through age 96 years. However, your health care provider may recommend screening at an earlier age if you have risk factors for colon cancer. On a yearly basis, your health care provider may provide home test kits to check for hidden blood in the stool.  Use of a small camera at the end of a tube, to directly examine the colon (sigmoidoscopy or colonoscopy), can detect the earliest forms of colorectal cancer. Talk to your health care provider about this at age 41, when routine screening begins. Direct exam of the colon should be repeated every 5 -10 years through age 54 years, unless early forms of pre-cancerous polyps or small growths are found.   People who are at an increased risk for hepatitis B should be screened for this virus. You are considered at high risk for hepatitis B if:  -You were born in a country where hepatitis B occurs often. Talk with your health care provider about which countries are considered high risk.  - Your parents were  born in a high-risk country and you have not received a shot to protect against hepatitis B (hepatitis B vaccine).  - You have HIV or AIDS.  - You use needles to inject street drugs.  - You live with, or have sex with, someone who has Hepatitis B.  - You get hemodialysis treatment.  - You take certain medicines for conditions like cancer, organ transplantation, and autoimmune conditions.   Hepatitis C blood testing is recommended for all people born from [birth year] through 1965 and any individual with known risks for hepatitis C.   Practice safe sex. Use condoms and avoid high-risk sexual practices to  reduce the spread of sexually transmitted infections (STIs). STIs include gonorrhea, chlamydia, syphilis, trichomonas, herpes, HPV, and human immunodeficiency virus (HIV). Herpes, HIV, and HPV are viral illnesses that have no cure. They can result in disability, cancer, and death. Sexually active women aged 24 years and younger should be checked for chlamydia. Older women with new or multiple partners should also be tested for chlamydia. Testing for other STIs is recommended if you are sexually active and at increased risk.   Osteoporosis is a disease in which the bones lose minerals and strength with aging. This can result in serious bone fractures or breaks. The risk of osteoporosis can be identified using a bone density scan. Women ages 58 years and over and women at risk for fractures or osteoporosis should discuss screening with their health care providers. Ask your health care provider whether you should take a calcium supplement or vitamin D to There are also several preventive steps women can take to avoid osteoporosis and resulting fractures or to keep osteoporosis from worsening. -->Recommendations include:  Eat a balanced diet high in fruits, vegetables, calcium, and vitamins.  Get enough calcium. The recommended total intake of is 1,200 mg daily; for best absorption, if taking supplements, divide doses into 250-500 mg doses throughout the day. Of the two types of calcium, calcium carbonate is best absorbed when taken with food but calcium citrate can be taken on an empty stomach.  Get enough vitamin D. NAMS and the [place] recommend at least 1,000 IU per day for women age 48 and over who are at risk of vitamin D deficiency. Vitamin D deficiency can be caused by inadequate sun exposure (for example, those who live in [place]).  Avoid alcohol and smoking. Heavy alcohol intake (more than 7 drinks per week) increases the risk of falls and hip fracture and women  smokers tend to lose bone more rapidly and have lower bone mass than nonsmokers. Stopping smoking is one of the most important changes women can make to improve their health and decrease risk for disease.  Be physically active every day. Weight-bearing exercise (for example, fast walking, hiking, jogging, and weight training) may strengthen bones or slow the rate of bone loss that comes with aging. Balancing and muscle-strengthening exercises can reduce the risk of falling and fracture.  Consider therapeutic medications. Currently, several types of effective drugs are available. Healthcare providers can recommend the type most appropriate for each woman.  Eliminate environmental factors that may contribute to accidents. Falls cause nearly 90% of all osteoporotic fractures, so reducing this risk is an important bone-health strategy. Measures include ample lighting, removing obstructions to walking, using nonskid rugs on floors, and placing mats and/or grab bars in showers.  Be aware of medication side effects. Some common medicines make bones weaker. These include a type of steroid drug called glucocorticoids used  for arthritis and asthma, some antiseizure drugs, certain sleeping pills, treatments for endometriosis, and some cancer drugs. An overactive thyroid gland or using too much thyroid hormone for an underactive thyroid can also be a problem. If you are taking these medicines, talk to your doctor about what you can do to help protect your bones.reduce the rate of osteoporosis.    Menopause can be associated with physical symptoms and risks. Hormone replacement therapy is available to decrease symptoms and risks. You should talk to your health care provider about whether hormone replacement therapy is right for you.   Use sunscreen. Apply sunscreen liberally and repeatedly throughout the day. You should seek shade when your shadow is shorter than you. Protect yourself by wearing long sleeves,  pants, a wide-brimmed hat, and sunglasses year round, whenever you are outdoors.   Once a month, do a whole body skin exam, using a mirror to look at the skin on your back. Tell your health care provider of new moles, moles that have irregular borders, moles that are larger than a pencil eraser, or moles that have changed in shape or color.   -Stay current with required vaccines (immunizations).   Influenza vaccine. All adults should be immunized every year.  Tetanus, diphtheria, and acellular pertussis (Td, Tdap) vaccine. Pregnant women should receive 1 dose of Tdap vaccine during each pregnancy. The dose should be obtained regardless of the length of time since the last dose. Immunization is preferred during the 27th 36th week of gestation. An adult who has not previously received Tdap or who does not know her vaccine status should receive 1 dose of Tdap. This initial dose should be followed by tetanus and diphtheria toxoids (Td) booster doses every 10 years. Adults with an unknown or incomplete history of completing a 3-dose immunization series with Td-containing vaccines should begin or complete a primary immunization series including a Tdap dose. Adults should receive a Td booster every 10 years.  Varicella vaccine. An adult without evidence of immunity to varicella should receive 2 doses or a second dose if she has previously received 1 dose. Pregnant females who do not have evidence of immunity should receive the first dose after pregnancy. This first dose should be obtained before leaving the health care facility. The second dose should be obtained 4 8 weeks after the first dose.  Human papillomavirus (HPV) vaccine. Females aged 79 26 years who have not received the vaccine previously should obtain the 3-dose series. The vaccine is not recommended for use in pregnant females. However, pregnancy testing is not needed before receiving a dose. If a female is found to be pregnant after receiving a  dose, no treatment is needed. In that case, the remaining doses should be delayed until after the pregnancy. Immunization is recommended for any person with an immunocompromised condition through the age of 45 years if she did not get any or all doses earlier. During the 3-dose series, the second dose should be obtained 4 8 weeks after the first dose. The third dose should be obtained 24 weeks after the first dose and 16 weeks after the second dose.  Zoster vaccine. One dose is recommended for adults aged 83 years or older unless certain conditions are present.  Measles, mumps, and rubella (MMR) vaccine. Adults born before [birth year] generally are considered immune to measles and mumps. Adults born in [birth year] or later should have 1 or more doses of MMR vaccine unless there is a contraindication to the vaccine or there is laboratory  evidence of immunity to each of the three diseases. A routine second dose of MMR vaccine should be obtained at least 28 days after the first dose for students attending postsecondary schools, health care workers, or international travelers. People who received inactivated measles vaccine or an unknown type of measles vaccine during 1963 1967 should receive 2 doses of MMR vaccine. People who received inactivated mumps vaccine or an unknown type of mumps vaccine before 1979 and are at high risk for mumps infection should consider immunization with 2 doses of MMR vaccine. For females of childbearing age, rubella immunity should be determined. If there is no evidence of immunity, females who are not pregnant should be vaccinated. If there is no evidence of immunity, females who are pregnant should delay immunization until after pregnancy. Unvaccinated health care workers born before 10 who lack laboratory evidence of measles, mumps, or rubella immunity or laboratory confirmation of disease should consider measles and mumps immunization with 2 doses of MMR vaccine or rubella immunization with 1  dose of MMR vaccine.  Pneumococcal 13-valent conjugate (PCV13) vaccine. When indicated, a person who is uncertain of her immunization history and has no record of immunization should receive the PCV13 vaccine. An adult aged 62 years or older who has certain medical conditions and has not been previously immunized should receive 1 dose of PCV13 vaccine. This PCV13 should be followed with a dose of pneumococcal polysaccharide (PPSV23) vaccine. The PPSV23 vaccine dose should be obtained at least 8 weeks after the dose of PCV13 vaccine. An adult aged 30 years or older who has certain medical conditions and previously received 1 or more doses of PPSV23 vaccine should receive 1 dose of PCV13. The PCV13 vaccine dose should be obtained 1 or more years after the last PPSV23 vaccine dose.  Pneumococcal polysaccharide (PPSV23) vaccine. When PCV13 is also indicated, PCV13 should be obtained first. All adults aged 63 years and older should be immunized. An adult younger than age 20 years who has certain medical conditions should be immunized. Any person who resides in a nursing home or long-term care facility should be immunized. An adult smoker should be immunized. People with an immunocompromised condition and certain other conditions should receive both PCV13 and PPSV23 vaccines. People with human immunodeficiency virus (HIV) infection should be immunized as soon as possible after diagnosis. Immunization during chemotherapy or radiation therapy should be avoided. Routine use of PPSV23 vaccine is not recommended for American Indians, [place] Natives, or people younger than 65 years unless there are medical conditions that require PPSV23 vaccine. When indicated, people who have unknown immunization and have no record of immunization should receive PPSV23 vaccine. One-time revaccination 5 years after the first dose of PPSV23 is recommended for people aged 53 64 years who have chronic kidney failure, nephrotic syndrome,  asplenia, or immunocompromised conditions. People who received 1 2 doses of PPSV23 before age 39 years should receive another dose of PPSV23 vaccine at age 63 years or later if at least 5 years have passed since the previous dose. Doses of PPSV23 are not needed for people immunized with PPSV23 at or after age 19 years.  Meningococcal vaccine. Adults with asplenia or persistent complement component deficiencies should receive 2 doses of quadrivalent meningococcal conjugate (MenACWY-D) vaccine. The doses should be obtained at least 2 months apart. Microbiologists working with certain meningococcal bacteria, [place] recruits, people at risk during an outbreak, and people who travel to or live in countries with a high rate of meningitis should be immunized.  A first-year college student up through age 2 years who is living in a residence hall should receive a dose if she did not receive a dose on or after her 16th birthday. Adults who have certain high-risk conditions should receive one or more doses of vaccine.  Hepatitis A vaccine. Adults who wish to be protected from this disease, have certain high-risk conditions, work with hepatitis A-infected animals, work in hepatitis A research labs, or travel to or work in countries with a high rate of hepatitis A should be immunized. Adults who were previously unvaccinated and who anticipate close contact with an international adoptee during the first 60 days after arrival in the [place] from a country with a high rate of hepatitis A should be immunized.  Hepatitis B vaccine.  Adults who wish to be protected from this disease, have certain high-risk conditions, may be exposed to blood or other infectious body fluids, are household contacts or sex partners of hepatitis B positive people, are clients or workers in certain care facilities, or travel to or work in countries with a high rate of hepatitis B should be immunized.  Haemophilus influenzae type b (Hib)  vaccine. A previously unvaccinated person with asplenia or sickle cell disease or having a scheduled splenectomy should receive 1 dose of Hib vaccine. Regardless of previous immunization, a recipient of a hematopoietic stem cell transplant should receive a 3-dose series 6 12 months after her successful transplant. Hib vaccine is not recommended for adults with HIV infection.  Preventive Services / Frequency Ages 69 to 39years  Blood pressure check.** / Every 1 to 2 years.  Lipid and cholesterol check.** / Every 5 years beginning at age 16.  Clinical breast exam.** / Every 3 years for women in their 33s and 2s.  BRCA-related cancer risk assessment.** / For women who have family members with a BRCA-related cancer (breast, ovarian, tubal, or peritoneal cancers).  Pap test.** / Every 2 years from ages 91 through 74. Every 3 years starting at age 62 through age 8 or 8 with a history of 3 consecutive normal Pap tests.  HPV screening.** / Every 3 years from ages 74 through ages 86 to 72 with a history of 3 consecutive normal Pap tests.  Hepatitis C blood test.** / For any individual with known risks for hepatitis C.  Skin self-exam. / Monthly.  Influenza vaccine. / Every year.  Tetanus, diphtheria, and acellular pertussis (Tdap, Td) vaccine.** / Consult your health care provider. Pregnant women should receive 1 dose of Tdap vaccine during each pregnancy. 1 dose of Td every 10 years.  Varicella vaccine.** / Consult your health care provider. Pregnant females who do not have evidence of immunity should receive the first dose after pregnancy.  HPV vaccine. / 3 doses over 6 months, if 87 and younger. The vaccine is not recommended for use in pregnant females. However, pregnancy testing is not needed before receiving a dose.  Measles, mumps, rubella (MMR) vaccine.** / You need at least 1 dose of MMR if you were born in 1957 or later. You may also need a 2nd dose. For females of childbearing age,  rubella immunity should be determined. If there is no evidence of immunity, females who are not pregnant should be vaccinated. If there is no evidence of immunity, females who are pregnant should delay immunization until after pregnancy.  Pneumococcal 13-valent conjugate (PCV13) vaccine.** / Consult your health care provider.  Pneumococcal polysaccharide (PPSV23) vaccine.** / 1 to 2 doses if you smoke  cigarettes or if you have certain conditions.  Meningococcal vaccine.** / 1 dose if you are age 61 to 63 years and a Market researcher living in a residence hall, or have one of several medical conditions, you need to get vaccinated against meningococcal disease. You may also need additional booster doses.  Hepatitis A vaccine.** / Consult your health care provider.  Hepatitis B vaccine.** / Consult your health care provider.  Haemophilus influenzae type b (Hib) vaccine.** / Consult your health care provider.  Ages 50 to 64years  Blood pressure check.** / Every 1 to 2 years.  Lipid and cholesterol check.** / Every 5 years beginning at age 101 years.  Lung cancer screening. / Every year if you are aged 73 80 years and have a 30-pack-year history of smoking and currently smoke or have quit within the past 15 years. Yearly screening is stopped once you have quit smoking for at least 15 years or develop a health problem that would prevent you from having lung cancer treatment.  Clinical breast exam.** / Every year after age 57 years.  BRCA-related cancer risk assessment.** / For women who have family members with a BRCA-related cancer (breast, ovarian, tubal, or peritoneal cancers).  Mammogram.** / Every year beginning at age 33 years and continuing for as long as you are in good health. Consult with your health care provider.  Pap test.** / Every 3 years starting at age 36 years through age 24 or 68 years with a history of 3 consecutive normal Pap tests.  HPV screening.** / Every 3  years from ages 23 years through ages 70 to 56 years with a history of 3 consecutive normal Pap tests.  Fecal occult blood test (FOBT) of stool. / Every year beginning at age 68 years and continuing until age 2 years. You may not need to do this test if you get a colonoscopy every 10 years.  Flexible sigmoidoscopy or colonoscopy.** / Every 5 years for a flexible sigmoidoscopy or every 10 years for a colonoscopy beginning at age 15 years and continuing until age 79 years.  Hepatitis C blood test.** / For all people born from [birth year] through 1965 and any individual with known risks for hepatitis C.  Skin self-exam. / Monthly.  Influenza vaccine. / Every year.  Tetanus, diphtheria, and acellular pertussis (Tdap/Td) vaccine.** / Consult your health care provider. Pregnant women should receive 1 dose of Tdap vaccine during each pregnancy. 1 dose of Td every 10 years.  Varicella vaccine.** / Consult your health care provider. Pregnant females who do not have evidence of immunity should receive the first dose after pregnancy.  Zoster vaccine.** / 1 dose for adults aged 9 years or older.  Measles, mumps, rubella (MMR) vaccine.** / You need at least 1 dose of MMR if you were born in 1957 or later. You may also need a 2nd dose. For females of childbearing age, rubella immunity should be determined. If there is no evidence of immunity, females who are not pregnant should be vaccinated. If there is no evidence of immunity, females who are pregnant should delay immunization until after pregnancy.  Pneumococcal 13-valent conjugate (PCV13) vaccine.** / Consult your health care provider.  Pneumococcal polysaccharide (PPSV23) vaccine.** / 1 to 2 doses if you smoke cigarettes or if you have certain conditions.  Meningococcal vaccine.** / Consult your health care provider.  Hepatitis A vaccine.** / Consult your health care provider.  Hepatitis B vaccine.** / Consult your health care provider.  Haemophilus  influenzae  type b (Hib) vaccine.** / Consult your health care provider.  Ages 50 years and over  Blood pressure check.** / Every 1 to 2 years.  Lipid and cholesterol check.** / Every 5 years beginning at age 32 years.  Lung cancer screening. / Every year if you are aged 11 80 years and have a 30-pack-year history of smoking and currently smoke or have quit within the past 15 years. Yearly screening is stopped once you have quit smoking for at least 15 years or develop a health problem that would prevent you from having lung cancer treatment.  Clinical breast exam.** / Every year after age 74 years.  BRCA-related cancer risk assessment.** / For women who have family members with a BRCA-related cancer (breast, ovarian, tubal, or peritoneal cancers).  Mammogram.** / Every year beginning at age 6 years and continuing for as long as you are in good health. Consult with your health care provider.  Pap test.** / Every 3 years starting at age 65 years through age 51 or 75 years with 3 consecutive normal Pap tests. Testing can be stopped between 65 and 70 years with 3 consecutive normal Pap tests and no abnormal Pap or HPV tests in the past 10 years.  HPV screening.** / Every 3 years from ages 45 years through ages 47 or 63 years with a history of 3 consecutive normal Pap tests. Testing can be stopped between 65 and 70 years with 3 consecutive normal Pap tests and no abnormal Pap or HPV tests in the past 10 years.  Fecal occult blood test (FOBT) of stool. / Every year beginning at age 29 years and continuing until age 87 years. You may not need to do this test if you get a colonoscopy every 10 years.  Flexible sigmoidoscopy or colonoscopy.** / Every 5 years for a flexible sigmoidoscopy or every 10 years for a colonoscopy beginning at age 4 years and continuing until age 51 years.  Hepatitis C blood test.** / For all people born from [birth year] through 1965 and any individual with known risks for  hepatitis C.  Osteoporosis screening.** / A one-time screening for women ages 55 years and over and women at risk for fractures or osteoporosis.  Skin self-exam. / Monthly.  Influenza vaccine. / Every year.  Tetanus, diphtheria, and acellular pertussis (Tdap/Td) vaccine.** / 1 dose of Td every 10 years.  Varicella vaccine.** / Consult your health care provider.  Zoster vaccine.** / 1 dose for adults aged 39 years or older.  Pneumococcal 13-valent conjugate (PCV13) vaccine.** / Consult your health care provider.  Pneumococcal polysaccharide (PPSV23) vaccine.** / 1 dose for all adults aged 31 years and older.  Meningococcal vaccine.** / Consult your health care provider.  Hepatitis A vaccine.** / Consult your health care provider.  Hepatitis B vaccine.** / Consult your health care provider.  Haemophilus influenzae type b (Hib) vaccine.** / Consult your health care provider. ** Family history and personal history of risk and conditions may change your health care provider's recommendations. Document Released: 01/06/2002 Document Revised: 08/31/2013  [facility] Patient Information 2014 [place], [place].   EXERCISE AND DIET:  We recommended that you start or continue a regular exercise program for good health. Regular exercise means any activity that makes your heart beat faster and makes you sweat.  We recommend exercising at least 30 minutes per day at least 3 days a week, preferably 5.  We also recommend a diet low in fat and sugar / carbohydrates.  Inactivity, poor dietary choices and  obesity can cause diabetes, heart attack, stroke, and kidney damage, among others.     ALCOHOL AND SMOKING:  Women should limit their alcohol intake to no more than 7 drinks/beers/glasses of wine (combined, not each!) per week. Moderation of alcohol intake to this level decreases your risk of breast cancer and liver damage.  ( And of course, no recreational drugs are part of a healthy lifestyle.)  Also,  you should not be smoking at all or even being exposed to second hand smoke. Most people know smoking can cause cancer, and various heart and lung diseases, but did you know it also contributes to weakening of your bones?  Aging of your skin?  Yellowing of your teeth and nails?   CALCIUM AND VITAMIN D:  Adequate intake of calcium and Vitamin D are recommended.  The recommendations for exact amounts of these supplements seem to change often, but generally speaking 600 mg of calcium (either carbonate or citrate) and 800 units of Vitamin D per day seems prudent. Certain women may benefit from higher intake of Vitamin D.  If you are among these women, your doctor will have told you during your visit.     PAP SMEARS:  Pap smears, to check for cervical cancer or precancers,  have traditionally been done yearly, although recent scientific advances have shown that most women can have pap smears less often.  However, every woman still should have a physical exam from her gynecologist or primary care physician every year. It will include a breast check, inspection of the vulva and vagina to check for abnormal growths or skin changes, a visual exam of the cervix, and then an exam to evaluate the size and shape of the uterus and ovaries.  And after 32 years of age, a rectal exam is indicated to check for rectal cancers. We will also provide age appropriate advice regarding health maintenance, like when you should have certain vaccines, screening for sexually transmitted diseases, bone density testing, colonoscopy, mammograms, etc.    MAMMOGRAMS:  All women over 14 years old should have a yearly mammogram. Many facilities now offer a "3D" mammogram, which may cost around $50 extra out of pocket. If possible,  we recommend you accept the option to have the 3D mammogram performed.  It both reduces the number of women who will be called back for extra views which then turn out to be normal, and it is better than the  routine mammogram at detecting truly abnormal areas.     COLONOSCOPY:  Colonoscopy to screen for colon cancer is recommended for all women at age 63.  We know, you hate the idea of the prep.  We agree, BUT, having colon cancer and not knowing it is worse!!  Colon cancer so often starts as a polyp that can be seen and removed at colonscopy, which can quite literally save your life!  And if your first colonoscopy is normal and you have no family history of colon cancer, most women don't have to have it again for 10 years.  Once every ten years, you can do something that may end up saving your life, right?  We will be happy to help you get it scheduled when you are ready.  Be sure to check your insurance coverage so you understand how much it will cost.  It may be covered as a preventative service at no cost, but you should check your particular policy.

## 2018-07-01 ENCOUNTER — Encounter: Payer: Self-pay | Admitting: Family Medicine

## 2018-07-01 ENCOUNTER — Other Ambulatory Visit (HOSPITAL_COMMUNITY)
Admission: RE | Admit: 2018-07-01 | Discharge: 2018-07-01 | Disposition: A | Discharge status: Home / Self Care | Payer: BLUE CROSS/BLUE SHIELD | Source: Ambulatory Visit | Attending: Family Medicine | Admitting: Family Medicine

## 2018-07-01 ENCOUNTER — Ambulatory Visit (INDEPENDENT_AMBULATORY_CARE_PROVIDER_SITE_OTHER): Payer: BLUE CROSS/BLUE SHIELD | Admitting: Family Medicine

## 2018-07-01 VITALS — BP 117/83 | HR 74 | Ht 66.0 in | Wt 154.4 lb

## 2018-07-01 DIAGNOSIS — N898 Other specified noninflammatory disorders of vagina: Secondary | ICD-10-CM | POA: Diagnosis not present

## 2018-07-01 DIAGNOSIS — Z719 Counseling, unspecified: Secondary | ICD-10-CM

## 2018-07-01 DIAGNOSIS — Z0000 Encounter for general adult medical examination without abnormal findings: Secondary | ICD-10-CM

## 2018-07-01 DIAGNOSIS — Z124 Encounter for screening for malignant neoplasm of cervix: Secondary | ICD-10-CM

## 2018-07-01 DIAGNOSIS — N888 Other specified noninflammatory disorders of cervix uteri: Secondary | ICD-10-CM

## 2018-07-01 NOTE — Progress Notes (Signed)
Impression and Recommendations:    1. Encounter for wellness examination   2. Health education/counseling   3. Screening for cervical cancer   4. Cervical discharge    -Patient defers tetanus shot today and will get next year.  She states she has had it approximately 9 1/2 years ago from outside source.  1) Anticipatory Guidance: Discussed importance of wearing a seatbelt while driving, not texting while driving; sunscreen when outside along with yearly skin surveillance; eating a well balanced and modest diet; physical activity at least 25 minutes per day or 150 min/ week of moderate to intense activity.  - Advised patient to perform self-breast exams monthly, at the same time each month to try to minimize hormonal variations.  Reviewed and demonstrated proper technique for self-breast exams.  - Educated patient on prudent feminine hygiene and prevention of concerns like bacterial vaginosis.  - Encouraged patient to consider OTC Vitamin D supplementation if desired.  2) Immunizations / Screenings / Labs:  All immunizations and screenings that patient agrees to, are up-to-date per recommendations or will be updated today.  Patient understands the needs for q 93mo dental and yearly vision screens which pt will schedule independently. Obtain CBC, CMP, HgA1c, Lipid panel, TSH and vit D when fasting if not already done recently.   - Pap smear obtained today.  - Need for tetanus vaccine.  3) Weight:  Discussed goal of losing even 5-10% of current body weight which would improve overall feelings of well being and improve objective health data significantly.   Improve nutrient density of diet through increasing intake of fruits and vegetables and decreasing saturated/trans fats, white flour products and refined sugar products.   4) Follow-Up - Return for regularly scheduled chronic follow-up.  - If patient wishes to try the FODMAP diet, or would like a referral to GI, patient knows to  [facility] for evaluation.  Gross side effects, risk and benefits, and alternatives of medications discussed with patient.  Patient is aware that all medications have potential side effects and we are unable to predict every side effect or drug-drug interaction that may occur.  Expresses verbal understanding and consents to current therapy plan and treatment regimen.  F-up preventative CPE in 1 year. F/up sooner for chronic care management as discussed and/or prn.  Please see orders placed and AVS handed out to patient at the end of our visit for further patient instructions/ counseling done pertaining to today's office visit.   This document serves as a record of services personally performed by Thomasene Lot, DO. It was created on her behalf by Peggye Fothergill, a trained medical scribe. The creation of this record is based on the scribe's personal observations and the provider's statements to them.   I have reviewed the above medical documentation for accuracy and completeness and I concur.  Thomasene Lot 07/01/18 9:35 AM     Subjective:    Chief Complaint  Patient presents with  . Annual Exam    HPI: Yesenia Johnson is a 32 y.o. female who presents to [facility] today a yearly health maintenance exam.  Health Maintenance Summary Reviewed and updated, unless pt declines services.  Colonoscopy:   Unnecessary secondary to patient being < 65 years old. Tobacco History Reviewed:   Y; never smoker. Alcohol:    No concerns, no excessive use Exercise Habits:   Not meeting AHA guidelines STD concerns:   None; with the same partner for 6 years. Drug  Use:   None Lumps or breast concerns:      no Breast Cancer Family History:      No  Patient last had her tetanus vaccine about ten years ago.  Last eye exam was about two years ago.  Patient does self breast exams in the shower every couple of months.  Patient checks her skin for changes and knows how to  screen herself.  Patient notes that she has ongoing issues with gas and has seen doctors for it in the past.  Otherwise denies constipation, diarrhea, problems urinating.  Notes during appointment today that she has an emotional response to pap smears.   Health Maintenance  Topic Date Due  . INFLUENZA VACCINE  06/24/2018  . PAP SMEAR  03/24/2020  . TETANUS/TDAP  03/30/2021  . HIV Screening  Completed     Wt Readings from Last 3 Encounters:  07/01/18 154 lb 6.4 oz (70 kg)  06/14/18 155 lb 6.4 oz (70.5 kg)  05/20/18 158 lb 4.8 oz (71.8 kg)   BP Readings from Last 3 Encounters:  07/01/18 117/83  06/14/18 118/75  05/20/18 138/88   Pulse Readings from Last 3 Encounters:  07/01/18 74  06/14/18 68  05/20/18 71     Past Medical History:  Diagnosis Date  . Depression       Past Surgical History:  Procedure Laterality Date  . WISDOM TOOTH EXTRACTION  11/2016      Family History  Problem Relation Age of Onset  . Hypertension Mother   . Diabetes Mother   . Stroke Maternal Grandmother   . Diabetes Paternal Grandmother   . Breast cancer Maternal Aunt   . Alcoholism Maternal Aunt   . Diabetes Paternal Aunt       Social History   Substance and Sexual Activity  Drug Use No  ,   Social History   Substance and Sexual Activity  Alcohol Use Yes  . Alcohol/week: 2.0 standard drinks  . Types: 2 Cans of beer per week  ,   Social History   Tobacco Use  Smoking Status Never Smoker  Smokeless Tobacco Never Used  ,   Social History   Substance and Sexual Activity  Sexual Activity Yes  . Partners: Male  . Birth control/protection: None    Current Outpatient Medications on File Prior to Visit  Medication Sig Dispense Refill  . venlafaxine (EFFEXOR) 37.5 MG tablet Take 1 tablet (37.5 mg total) by mouth daily. 90 tablet 1   No current facility-administered medications on file prior to visit.     Allergies: Penicillins  Review of Systems: General:    Denies fever, chills, unexplained weight loss.  Optho/Auditory:   Denies visual changes, blurred vision/LOV Respiratory:   Denies SOB, DOE more than baseline levels.  Cardiovascular:   Denies chest pain, palpitations, new onset peripheral edema  Gastrointestinal:   Denies nausea, vomiting, diarrhea.  Genitourinary: Denies dysuria, freq/ urgency, flank pain or discharge from genitals.  Endocrine:     Denies hot or cold intolerance, polyuria, polydipsia. Musculoskeletal:   Denies unexplained myalgias, joint swelling, unexplained arthralgias, gait problems.  Skin:  Denies rash, suspicious lesions Neurological:     Denies dizziness, unexplained weakness, numbness  Psychiatric/Behavioral:   Denies mood changes, suicidal or homicidal ideations, hallucinations    Objective:    Blood pressure 117/83, pulse 74, height 5\' 6"  (1.676 m), weight 154 lb 6.4 oz (70 kg), last menstrual period 06/10/2018, SpO2 100 %. Body mass index is 24.92 kg/m. General Appearance:  Alert, cooperative, no distress, appears stated age  Head:    Normocephalic, without obvious abnormality, atraumatic  Eyes:    PERRL, conjunctiva/corneas clear, EOM's intact, fundi    benign, both eyes  Ears:    Normal TM's and external ear canals, both ears  Nose:   Nares normal, septum midline, mucosa normal, no drainage    or sinus tenderness  Throat:   Lips w/o lesion, mucosa moist, and tongue normal; teeth and   gums normal  Neck:   Supple, symmetrical, trachea midline, no adenopathy;    thyroid:  no enlargement/tenderness/nodules; no carotid   bruit or JVD  Back:     Symmetric, no curvature, ROM normal, no CVA tenderness  Lungs:     Clear to auscultation bilaterally, respirations unlabored, no       Wh/ R/ R  Chest Wall:    No tenderness or gross deformity; normal excursion   Heart:    Regular rate and rhythm, S1 and S2 normal, no murmur, rub   or gallop  Breast Exam:    No tenderness, masses, or nipple abnormality b/l; no  d/c  Abdomen:     Soft, non-tender, bowel sounds active all four quadrants, NO   G/R/R, no masses, no organomegaly  Genitalia:    Ext genitalia: without lesion, no rash or discharge, No         tenderness;  Cervix: WNL's w/o discharge or lesion;        Adnexa:  No tenderness or palpable masses   Rectal:   Deferred; not performed.  Extremities:   Extremities normal, atraumatic, no cyanosis or gross edema  Pulses:   2+ and symmetric all extremities  Skin:   Warm, dry, Skin color, texture, turgor normal, no obvious rashes or lesions Psych: No HI/SI, judgement and insight good, Euthymic mood. Full Affect.  Neurologic:   CNII-XII intact, normal strength, sensation and reflexes    Throughout

## 2018-07-06 LAB — CYTOLOGY - PAP
BACTERIAL VAGINITIS: NEGATIVE
Candida vaginitis: NEGATIVE
DIAGNOSIS: NEGATIVE
HPV: NOT DETECTED

## 2018-08-20 ENCOUNTER — Encounter (HOSPITAL_COMMUNITY): Payer: Self-pay | Admitting: Emergency Medicine

## 2018-08-20 ENCOUNTER — Ambulatory Visit (HOSPITAL_COMMUNITY)
Admission: EM | Admit: 2018-08-20 | Discharge: 2018-08-20 | Disposition: A | Discharge status: Home / Self Care | Payer: BLUE CROSS/BLUE SHIELD | Attending: Family Medicine | Admitting: Family Medicine

## 2018-08-20 DIAGNOSIS — B356 Tinea cruris: Secondary | ICD-10-CM

## 2018-08-20 MED ORDER — KETOCONAZOLE 2 % EX CREA: 1.0000 "application " | TOPICAL_CREAM | Freq: Two times a day (BID) | CUTANEOUS | 0 refills | Status: DC

## 2018-08-20 MED ORDER — FLUCONAZOLE 150 MG PO TABS: 150.0000 mg | ORAL_TABLET | Freq: Once | ORAL | 0 refills | Status: AC

## 2018-08-20 NOTE — ED Provider Notes (Signed)
[facility]    CSN: [id number] Arrival date & time: 08/20/18  1044     History   Chief Complaint Chief Complaint  Patient presents with  . Skin Problem    HPI Yesenia Johnson is a 32 y.o. female.   Pt states she has some irritation and broken skin on her tailbone for 1 week.  There is a longitudinal fissure the patient is noticed getting better and then worse all week long.  She has not tried any creams at this point.  Patient states that she is prone to yeast infections.  She comes from [place] in the summer has been dreadfully hot in comparison to her previous domicile.     Past Medical History:  Diagnosis Date  . Depression     Patient Active Problem List   Diagnosis Date Noted  . Dysuria 06/14/2018  . Abnormal urinalysis 06/14/2018  . Counseling on health promotion and disease prevention 01/21/2018  . Nutritional counseling 01/21/2018  . Depression 06/16/2017  . Overweight (BMI 25.0-29.9) 06/16/2017  . Environmental and seasonal allergies 06/16/2017  . Family history of diabetes mellitus in mother 06/16/2017  . Family history of hypertension- mom 06/16/2017  . Family history of breast cancer- in M aunt in her early 78's 06/16/2017  . Pap smear of cervix shows high risk HPV present- in past, had colposcopy, 06/16/2017    Past Surgical History:  Procedure Laterality Date  . WISDOM TOOTH EXTRACTION  11/2016    OB History   None      Home Medications    Prior to Admission medications   Medication Sig Start Date End Date Taking? Authorizing Provider  fluconazole (DIFLUCAN) 150 MG tablet Take 1 tablet (150 mg total) by mouth once for 1 dose. Repeat if needed 08/20/18 08/20/18  Elvina Sidle, MD  ketoconazole (NIZORAL) 2 % cream Apply 1 application topically 2 (two) times daily. 08/20/18   Elvina Sidle, MD  venlafaxine (EFFEXOR) 37.5 MG tablet Take 1 tablet (37.5 mg total) by mouth daily. 06/17/18   Thomasene Lot, DO    Family  History Family History  Problem Relation Age of Onset  . Hypertension Mother   . Diabetes Mother   . Stroke Maternal Grandmother   . Diabetes Paternal Grandmother   . Breast cancer Maternal Aunt   . Alcoholism Maternal Aunt   . Diabetes Paternal Aunt     Social History Social History   Tobacco Use  . Smoking status: Never Smoker  . Smokeless tobacco: Never Used  Substance Use Topics  . Alcohol use: Yes    Alcohol/week: 2.0 standard drinks    Types: 2 Cans of beer per week  . Drug use: No     Allergies   Penicillins   Review of Systems Review of Systems  Skin: Positive for rash.  All other systems reviewed and are negative.    Physical Exam Triage Vital Signs ED Triage Vitals [08/20/18 1112]  Enc Vitals Group     BP (!) 143/90     Pulse Rate 79     Resp 16     Temp 97.8 F (36.6 C)     Temp src      SpO2 98 %     Weight      Height      Head Circumference      Peak Flow      Pain Score      Pain Loc      Pain Edu?  Excl. in GC?    No data found.  Updated Vital Signs BP (!) 143/90   Pulse 79   Temp 97.8 F (36.6 C)   Resp 16   LMP 07/23/2018   SpO2 98%    Physical Exam  Constitutional: She is oriented to person, place, and time. She appears well-developed and well-nourished.  HENT:  Right Ear: External ear normal.  Left Ear: External ear normal.  Eyes: Conjunctivae are normal.  Neck: Normal range of motion. Neck supple.  Pulmonary/Chest: Effort normal.  Musculoskeletal: Normal range of motion.  Neurological: She is alert and oriented to person, place, and time.  Skin: Skin is warm.  3 cm longitudinal fissure with surrounding 5 mm of erythema in the gluteal cleft  Nursing note and vitals reviewed.    UC Treatments / Results  Labs (all labs ordered are listed, but only abnormal results are displayed) Labs Reviewed - No data to display  EKG None  Radiology No results found.  Procedures Procedures (including critical care  time)  Medications Ordered in UC Medications - No data to display  Initial Impression / Assessment and Plan / UC Course  I have reviewed the triage vital signs and the nursing notes.  Pertinent labs & imaging results that were available during my care of the patient were reviewed by me and considered in my medical decision making (see chart for details).    Final Clinical Impressions(s) / UC Diagnoses   Final diagnoses:  Tinea cruris     Discharge Instructions     This is a common problem and hot and humid climates.  Its caused by a fungus that penetrates the skin where one has darkened skin folds.    ED Prescriptions    Medication Sig Dispense Auth. Provider   ketoconazole (NIZORAL) 2 % cream Apply 1 application topically 2 (two) times daily. 30 g Elvina Sidle, MD   fluconazole (DIFLUCAN) 150 MG tablet Take 1 tablet (150 mg total) by mouth once for 1 dose. Repeat if needed 2 tablet Elvina Sidle, MD     Controlled Substance Prescriptions [place] Controlled Substance Registry consulted? Not Applicable   Elvina Sidle, MD 08/20/18 1149

## 2018-08-20 NOTE — ED Triage Notes (Signed)
Pt states she has some irritation and broken skin on her tailbone for 1 week.

## 2018-08-20 NOTE — Discharge Instructions (Addendum)
This is a common problem and hot and humid climates.  Its caused by a fungus that penetrates the skin where one has darkened skin folds.

## 2018-12-08 ENCOUNTER — Other Ambulatory Visit: Payer: Self-pay | Admitting: Family Medicine

## 2018-12-08 DIAGNOSIS — F32A Depression, unspecified: Secondary | ICD-10-CM

## 2018-12-08 DIAGNOSIS — F329 Major depressive disorder, single episode, unspecified: Secondary | ICD-10-CM

## 2019-06-11 ENCOUNTER — Other Ambulatory Visit: Payer: Self-pay | Admitting: Family Medicine

## 2019-06-11 DIAGNOSIS — F32A Depression, unspecified: Secondary | ICD-10-CM

## 2019-06-11 DIAGNOSIS — F329 Major depressive disorder, single episode, unspecified: Secondary | ICD-10-CM

## 2019-07-11 ENCOUNTER — Other Ambulatory Visit: Payer: Self-pay | Admitting: Family Medicine

## 2019-07-11 DIAGNOSIS — F329 Major depressive disorder, single episode, unspecified: Secondary | ICD-10-CM

## 2019-07-11 DIAGNOSIS — F32A Depression, unspecified: Secondary | ICD-10-CM

## 2019-07-26 ENCOUNTER — Other Ambulatory Visit: Payer: Self-pay | Admitting: Family Medicine

## 2019-07-26 DIAGNOSIS — F32A Depression, unspecified: Secondary | ICD-10-CM

## 2019-07-26 DIAGNOSIS — F329 Major depressive disorder, single episode, unspecified: Secondary | ICD-10-CM

## 2019-08-15 ENCOUNTER — Other Ambulatory Visit: Payer: Self-pay | Admitting: Family Medicine

## 2019-08-15 DIAGNOSIS — F329 Major depressive disorder, single episode, unspecified: Secondary | ICD-10-CM

## 2019-08-15 DIAGNOSIS — F32A Depression, unspecified: Secondary | ICD-10-CM

## 2019-08-15 MED ORDER — VENLAFAXINE HCL 37.5 MG PO TABS: 37.5000 mg | ORAL_TABLET | Freq: Every day | ORAL | 0 refills | Status: DC

## 2019-08-15 NOTE — Telephone Encounter (Signed)
Patient down to last of 15day allowance of : (Patient made appt for 9/30 @ 4pm but wishes provider to give her enough Rx to get to that day--advised pt that she had already rcvd the quanity of (15) which is the last allowance before (0) gvn--  venlafaxine (EFFEXOR) 37.5 MG tablet [[id number]]   Order Details Dose: 37.5 mg Route: Oral Frequency: Daily  Dispense Quantity: 15 tablet Refills: 0 Fills remaining: --        Sig: TAKE 1 TABLET (37.5 MG TOTAL) BY MOUTH DAILY. NEEDS OFFICE VISIT FOR FURTHER REFILLS          Forwarding request to medical assistant for review w/provider if approved send refill order to :   [id number] - [place], [place] - [address] [phone] (Phone) [phone] (Fax)   --glh

## 2019-08-22 ENCOUNTER — Ambulatory Visit: Payer: BC Managed Care – PPO | Admitting: Family Medicine

## 2019-08-22 ENCOUNTER — Other Ambulatory Visit: Payer: Self-pay

## 2019-08-22 ENCOUNTER — Encounter: Payer: Self-pay | Admitting: Family Medicine

## 2019-08-22 VITALS — BP 128/80 | HR 71 | Ht 66.5 in | Wt 161.8 lb

## 2019-08-22 DIAGNOSIS — Z23 Encounter for immunization: Secondary | ICD-10-CM

## 2019-08-22 DIAGNOSIS — R740 Nonspecific elevation of levels of transaminase and lactic acid dehydrogenase [LDH]: Secondary | ICD-10-CM

## 2019-08-22 DIAGNOSIS — F320 Major depressive disorder, single episode, mild: Secondary | ICD-10-CM

## 2019-08-22 DIAGNOSIS — R2231 Localized swelling, mass and lump, right upper limb: Secondary | ICD-10-CM

## 2019-08-22 DIAGNOSIS — Z30018 Encounter for initial prescription of other contraceptives: Secondary | ICD-10-CM | POA: Diagnosis not present

## 2019-08-22 DIAGNOSIS — R7401 Elevation of levels of liver transaminase levels: Secondary | ICD-10-CM

## 2019-08-22 MED ORDER — VENLAFAXINE HCL ER 37.5 MG PO CP24: 37.5000 mg | ORAL_CAPSULE | Freq: Every day | ORAL | 1 refills | Status: DC

## 2019-08-22 MED ORDER — ETONOGESTREL-ETHINYL ESTRADIOL 0.12-0.015 MG/24HR VA RING: VAGINAL_RING | VAGINAL | 12 refills | Status: DC

## 2019-08-22 NOTE — Progress Notes (Signed)
Subjective:    Patient ID: Yesenia Johnson, female    DOB: [birth year], 33 y.o.   MRN: [id number]  HPI Chief Complaint  Patient presents with  . new pt    new pt get established. needs med refill and would like extended release. bump on middle finger on right hand, flu shot given   She is new to the practice and here to establish care.  Previous medical care: moved here from [place] in 2018 and has been going to [facility] clinic since living here. States that office is far away for her now. Would like to establish care here.   Other providers: [facility] - Dr. Raliegh Scarlet   Her main concern today is to get a refill of Effexor. She has been on this medication for several years. States she has been on the 75 mg dose but at one point her plan was to wean off but then she realized her depression worsened and that she needed to stay on the 37.5 mg dose. She would like to try the extended release version. Reports recently she has been worried about running out of her medication before her new patient appt today so she was only taking half the dose to make it last.  Denies anxiety. Denies suicidal thoughts.   She is not currently seeing a therapist but has in the past.  She has seen a psychiatrist in the past as well.   Has taken Wellbutrin in the past but did not think it was helping. She then was on a different medication and it became to expensive for her.    Reports history of allergies but not requiring medication.    Right 3rd digit with a wart like bump for the past 3 months and not really changing. She has not tried anything for this. States it does not really bother her.    Social history: Lives with husband and no children, 2 dogs and a cat. 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  Drinks 3-4 alcohol beverages per week.   History of mildly elevated ALT in the past. No follow up. She is aware of  this.   Last Gynecological Exam: states last pap smear was last year and has had 2 normal paps since having a LEEP in [place].  States she has never been pregnant and has not desire to become pregnant.  States she has been on birth control pills in the past but had issues remembering the pill daily. Would like to try the Nuvaring. She has used this in the past and did well with it.  States her husband is going to get a vasectomy at some point when they can afford it.   Reports periods are "regularly irregular".  Does not have an OB/gYN here.   Denies fever, chills, dizziness, chest pain, palpitations, shortness of breath, abdominal pain, N/V/D, urinary symptoms, LE edema.     Depression screen [facility] 2/9 08/22/2019 07/01/2018 06/14/2018 05/20/2018 05/20/2018  Decreased Interest 3 0 0 0 0  Down, Depressed, Hopeless 0 0 0 0 0  PHQ - 2 Score 3 0 0 0 0  Altered sleeping 1 0 1 0 -  Tired, decreased energy 3 0 1 1 -  Change in appetite 0 1 0 0 -  Feeling bad or failure about yourself  0 0 0 0 -  Trouble concentrating 0 0 0 0 -  Moving slowly  or fidgety/restless 1 0 0 0 -  Suicidal thoughts 0 0 0 0 -  PHQ-9 Score 8 1 2 1  -  Difficult doing work/chores Very difficult - Not difficult at all - -    Reviewed allergies, medications, past medical, surgical, family, and social history.   Review of Systems Pertinent positives and negatives in the history of present illness.     Objective:   Physical Exam BP 128/80   Pulse 71   Ht 5' 6.5" (1.689 m)   Wt 161 lb 12.8 oz (73.4 kg)   LMP 08/08/2019   SpO2 99%   BMI 25.72 kg/m   Alert and oriented and in no distress.  Neck is supple without adenopathy or thyromegaly. Cardiac exam shows a regular sinus rhythm without murmurs or gallops. Lungs are clear to auscultation. Extremities without edema. Skin is warm and dry. She has a round, wart-like mass on the medial side of her 3rd right finger, normal hand exam otherwise. PERRLA. CNs intact. Normal  gait.        Assessment & Plan:  Current mild episode of major depressive disorder, unspecified whether recurrent (HCC) - Plan: venlafaxine XR (EFFEXOR-XR) 37.5 MG 24 hr capsule -She has a long history of depression and has seen a psychiatrist and therapist in the past. Has tried at least 2 other medications without success. Declines referral to therapy to psychiatrist today. Denies SI. Appears to have been stable on Effexor but would like to try the extended release which I will send in for her today.  She will follow-up in 4 weeks or sooner if needed.  Elevated ALT measurement - Plan: Comprehensive metabolic panel -Recheck liver function and follow-up pending results  Needs flu shot - Plan: Flu Vaccine QUAD 36+ mos IM  Encounter for prescription for nuvaring - Plan: etonogestrel-ethinyl estradiol (NUVARING) 0.12-0.015 MG/24HR vaginal ring -She does not desire pregnancy.  She has used the NuvaRing in the past and did well with this.  Pap smear is up-to-date.  She will try this and if she desires a different contraception method such as an IUD, I will happily refer her to gynecology.  Finger mass, right -Discussed that this appears to be a wart and she will try over-the-counter Compound W if she would like. May see derm if this gets larger or more bothersome.

## 2019-08-22 NOTE — Patient Instructions (Signed)
Ethinyl Estradiol; Etonogestrel vaginal ring What is this medicine? ETHINYL ESTRADIOL; ETONOGESTREL (ETH in il es tra DYE ole; et oh noe JES trel) vaginal ring is a flexible, vaginal ring used as a contraceptive (birth control method). This medicine combines 2 types of female hormones, an estrogen and a progestin. This ring is used to prevent ovulation and pregnancy. Each ring is effective for 1 month. This medicine may be used for other purposes; ask your health care provider or pharmacist if you have questions. COMMON BRAND NAME(S): EluRyng, NuvaRing What should I tell my health care provider before I take this medicine? They need to know if you have any of these conditions:  abnormal vaginal bleeding  blood vessel disease or blood clots  breast, cervical, endometrial, ovarian, liver, or uterine cancer  diabetes  gallbladder disease  having surgery  heart disease or recent heart attack  high blood pressure  high cholesterol or triglycerides  history of irregular heartbeat or heart valve problems  kidney disease  liver disease  migraine headaches  protein C deficiency  protein S deficiency  recently had a baby, miscarriage, or abortion  stroke  systemic lupus erythematosus (SLE)  tobacco smoker  your age is more than 33 years old  an unusual or allergic reaction to estrogens, progestins, other medicines, foods, dyes, or preservatives  pregnant or trying to get pregnant  breast-feeding How should I use this medicine? Insert the ring into your vagina as directed. Follow the directions on the prescription label. The ring will remain place for 3 weeks and is then removed for a 1-week break. A new ring is inserted 1 week after the last ring was removed, on the same day of the week. Check often to make sure the ring is still in place. If the ring was out of the vagina for an unknown amount of time, you may not be protected from pregnancy. Perform a pregnancy test and  call your doctor. Do not use more often than directed. A patient package insert for the product will be given with each prescription and refill. Read this sheet carefully each time. The sheet may change frequently. Contact your pediatrician regarding the use of this medicine in children. Special care may be needed. Overdosage: If you think you have taken too much of this medicine contact a poison control center or emergency room at once. NOTE: This medicine is only for you. Do not share this medicine with others. What if I miss a dose? You will need to use the ring exactly as directed. It is very important to follow the schedule every cycle. If you do not use the ring as directed, you may not be protected from pregnancy. If the ring should slip out, is lost, or if you leave it in longer or shorter than you should, contact your health care professional for advice. What may interact with this medicine? Do not take this medicine with the following medications:  dasabuvir; ombitasvir; paritaprevir; ritonavir  ombitasvir; paritaprevir; ritonavir  vaginal lubricants or other vaginal products that are oil-based or silicone-based This medicine may also interact with the following medications:  acetaminophen  antibiotics or medicines for infections, especially rifampin, rifabutin, rifapentine, and griseofulvin, and possibly penicillins or tetracyclines  aprepitant or fosaprepitant  armodafinil  ascorbic acid (vitamin C)  barbiturate medicines, such as phenobarbital or primidone  bosentan  certain antiviral medicines for hepatitis, HIV or AIDS  certain medicines for cancer treatment  certain medicines for seizures like carbamazepine, clobazam, felbamate, lamotrigine, oxcarbazepine, phenytoin,   rufinamide, topiramate  certain medicines for treating high cholesterol  cyclosporine  dantrolene  elagolix  flibanserin  grapefruit juice  lesinurad  medicines for diabetes  medicines  to treat fungal infections, such as griseofulvin, miconazole, fluconazole, ketoconazole, itraconazole, posaconazole or voriconazole  mifepristone  mitotane  modafinil  morphine  mycophenolate  St. John's wort  tamoxifen  temazepam  theophylline or aminophylline  thyroid hormones  tizanidine  tranexamic acid  ulipristal  warfarin This list may not describe all possible interactions. Give your health care provider a list of all the medicines, herbs, non-prescription drugs, or dietary supplements you use. Also tell them if you smoke, drink alcohol, or use illegal drugs. Some items may interact with your medicine. What should I watch for while using this medicine? Visit your doctor or health care professional for regular checks on your progress. You will need a regular breast and pelvic exam and Pap smear while on this medicine. Check with your doctor or health care professional to see if you need an additional method of contraception during the first cycle that you use this ring. Female condoms (made with natural rubber latex, polyisoprene, and polyurethane) and spermicides may be used. Do not use a diaphragm, cervical cap, or a female condom, as the ring can interfere with these birth control methods and their proper placement. If you have any reason to think you are pregnant, stop using this medicine right away and contact your doctor or health care professional. If you are using this medicine for hormone related problems, it may take several cycles of use to see improvement in your condition. Smoking increases the risk of getting a blood clot or having a stroke while you are using hormonal birth control, especially if you are more than 33 years old. You are strongly advised not to smoke. Some women are prone to getting dark patches on the skin of the face (cholasma). Your risk of getting chloasma with this medicine is higher if you had chloasma during a pregnancy. Keep out of the  sun. If you cannot avoid being in the sun, wear protective clothing and use sunscreen. Do not use sun lamps or tanning beds/booths. This medicine can make your body retain fluid, making your fingers, hands, or ankles swell. Your blood pressure can go up. Contact your doctor or health care professional if you feel you are retaining fluid. If you are going to have elective surgery, you may need to stop using this medicine before the surgery. Consult your health care professional for advice. This medicine does not protect you against HIV infection (AIDS) or any other sexually transmitted diseases. What side effects may I notice from receiving this medicine? Side effects that you should report to your doctor or health care professional as soon as possible:  allergic reactions such as skin rash or itching, hives, swelling of the lips, mouth, tongue, or throat  depression  high blood pressure  migraines or severe, sudden headaches  signs and symptoms of a blood clot such as breathing problems; changes in vision; chest pain; severe, sudden headache; pain, swelling, warmth in the leg; trouble speaking; sudden numbness or weakness of the face, arm or leg  signs and symptoms of infection like fever or chills with dizziness and a sunburn-like rash, or pain or trouble passing urine  stomach pain  symptoms of vaginal infection like itching, irritation or unusual discharge  yellowing of the eyes or skin Side effects that usually do not require medical attention (report these to your doctor   or health care professional if they continue or are bothersome):  acne  breast pain, tenderness  irregular vaginal bleeding or spotting, particularly during the first month of use  mild headache  nausea  painful periods  vomiting This list may not describe all possible side effects. Call your doctor for medical advice about side effects. You may report side effects to FDA at 1-800-FDA-1088. Where should I  keep my medicine? Keep out of the reach of children. Store unopened rings in the original foil pouch at room temperature between 20 and 25 degrees C (68 and 77 degrees F) for up to 4 months. Protect from light. Do not store above 30 degrees C (86 degrees F). Throw away any unused medicine after the expiration date. A ring may only be used for 1 cycle (1 month). After the 3-week cycle, a used ring is removed and should be placed in the re-closable foil pouch and discarded in the trash out of reach of children and pets. Do NOT flush down the toilet. NOTE: This sheet is a summary. It may not cover all possible information. If you have questions about this medicine, talk to your doctor, pharmacist, or health care provider.  2020 Elsevier/Gold Standard (2017-07-10 14:41:10)  

## 2019-08-23 LAB — COMPREHENSIVE METABOLIC PANEL
ALT: 31 IU/L (ref 0–32)
AST: 23 IU/L (ref 0–40)
Albumin/Globulin Ratio: 1.6 (ref 1.2–2.2)
Albumin: 4.4 g/dL (ref 3.8–4.8)
Alkaline Phosphatase: 81 IU/L (ref 39–117)
BUN/Creatinine Ratio: 15 (ref 9–23)
BUN: 14 mg/dL (ref 6–20)
Bilirubin Total: 0.9 mg/dL (ref 0.0–1.2)
CO2: 23 mmol/L (ref 20–29)
Calcium: 9.7 mg/dL (ref 8.7–10.2)
Chloride: 104 mmol/L (ref 96–106)
Creatinine, Ser: 0.94 mg/dL (ref 0.57–1.00)
GFR calc Af Amer: 92 mL/min/{1.73_m2} (ref >59)
GFR calc non Af Amer: 80 mL/min/{1.73_m2} (ref >59)
Globulin, Total: 2.8 g/dL (ref 1.5–4.5)
Glucose: 87 mg/dL (ref 65–99)
Potassium: 4.4 mmol/L (ref 3.5–5.2)
Sodium: 139 mmol/L (ref 134–144)
Total Protein: 7.2 g/dL (ref 6.0–8.5)

## 2019-08-24 ENCOUNTER — Ambulatory Visit: Payer: BLUE CROSS/BLUE SHIELD | Admitting: Family Medicine

## 2019-09-05 ENCOUNTER — Encounter: Payer: Self-pay | Admitting: Family Medicine

## 2019-09-05 DIAGNOSIS — Z20828 Contact with and (suspected) exposure to other viral communicable diseases: Secondary | ICD-10-CM | POA: Diagnosis not present

## 2019-09-06 ENCOUNTER — Encounter: Payer: Self-pay | Admitting: Family Medicine

## 2019-09-06 ENCOUNTER — Ambulatory Visit: Payer: BC Managed Care – PPO | Admitting: Family Medicine

## 2019-09-06 ENCOUNTER — Other Ambulatory Visit: Payer: Self-pay

## 2019-09-06 VITALS — Wt 160.0 lb

## 2019-09-06 DIAGNOSIS — F32A Depression, unspecified: Secondary | ICD-10-CM

## 2019-09-06 DIAGNOSIS — F329 Major depressive disorder, single episode, unspecified: Secondary | ICD-10-CM

## 2019-09-06 MED ORDER — VENLAFAXINE HCL ER 75 MG PO CP24: 75.0000 mg | ORAL_CAPSULE | Freq: Every day | ORAL | 2 refills | Status: DC

## 2019-09-06 NOTE — Progress Notes (Signed)
   Subjective:  Documentation for virtual audio and video telecommunications through [place] encounter:  The patient was located at home. 2 patient identifiers used.  The provider was located in the office. The patient did consent to this visit and is aware of possible charges through their insurance for this visit.  The other persons participating in this telemedicine service were none.     Patient ID: Yesenia Johnson, female    DOB: 1986/04/28, 33 y.o.   MRN: [id number]  HPI Chief Complaint  Patient presents with  . Follow-up    changed dose to extended release but not working either.    She would like to increase her dose of Effexor from 37.5 to 75 mg to see if this will help with her depression. She has been on this dose in the past and was trying to wean off prior to seeing me on 08/22/2019. We switched her from Effexor to the XR version.   Complains of feeling irritable. Does not want to do things she typically enjoys. Does not eat or cook.  Denies suicidal thoughts.   She has been on Pristiq and Wellbutrin in the past. These did not help.   Has been under the care of a psychiatrist and therapist in the past but does not want to schedule with them now.   States she was exposed to Covid-19 at her job and got tested yesterday. Awaiting results. Has fatigue but no other symptoms.   Denies fever, chills, dizziness, chest pain, palpitations, shortness of breath, abdominal pain, N/V/D.    Reviewed allergies, medications, past medical, surgical, family, and social history.    Review of Systems Pertinent positives and negatives in the history of present illness.     Objective:   Physical Exam Wt 160 lb (72.6 kg)   LMP 08/08/2019   BMI 25.44 kg/m   Alert and oriented and in no acute distress. Normal speech and thought process.       Assessment & Plan:  Depression, unspecified depression type - Plan: venlafaxine XR (EFFEXOR XR) 75 MG 24 hr capsule  She does not  appear to be in any danger. No SI or HI.  Will increase her dose to venlafaxine XR 75 mg and follow up with her in 2 weeks.  Consider referral to psychiatry and counseling if not improving.  She is awaiting Covid-19 test result due to recent close exposure and is asymptomatic.   Time spent on call was 14 minutes and in review of previous records 2 minutes total.  This virtual service is not related to other E/M service within previous 7 days.

## 2019-09-19 ENCOUNTER — Ambulatory Visit: Payer: BC Managed Care – PPO | Admitting: Family Medicine

## 2019-09-21 ENCOUNTER — Ambulatory Visit: Payer: BC Managed Care – PPO | Admitting: Family Medicine

## 2019-09-21 ENCOUNTER — Other Ambulatory Visit: Payer: Self-pay

## 2019-09-21 ENCOUNTER — Encounter: Payer: Self-pay | Admitting: Family Medicine

## 2019-09-21 VITALS — Wt 160.0 lb

## 2019-09-21 DIAGNOSIS — F320 Major depressive disorder, single episode, mild: Secondary | ICD-10-CM

## 2019-09-21 NOTE — Progress Notes (Signed)
   Subjective:  Documentation for virtual audio and video telecommunications through [place] encounter:  The patient was located in her car.  2 patient identifiers used.  The provider was located in the office. The patient did consent to this visit and is aware of possible charges through their insurance for this visit.  The other persons participating in this telemedicine service were none.    Patient ID: Yesenia Johnson, female    DOB: 1986/07/19, 33 y.o.   MRN: [id number]  HPI Chief Complaint  Patient presents with  . depression    depression- doing very well on higher dose. back to normal self   This is a 2 week follow up on depression. States depression is much improved since increasing dose of Effexor. She is pleased with results. No new concerns.    Review of Systems Pertinent positives and negatives in the history of present illness.     Objective:   Physical Exam Wt 160 lb (72.6 kg)   BMI 25.44 kg/m   Alert and oriented and in no acute distress.       Assessment & Plan:  Current mild episode of major depressive disorder, unspecified whether recurrent ([place])  Reports doing very well on the higher dose of Effexor. No new concerns or issues.   Continue on Effexor XR 75 mg and follow up in 4 months or sooner if needed.    Time spent on call was 9 minutes and in review of previous records 2 minutes total.  This virtual service is not related to other E/M service within previous 7 days.

## 2019-09-28 ENCOUNTER — Other Ambulatory Visit: Payer: Self-pay | Admitting: Family Medicine

## 2019-09-28 DIAGNOSIS — F329 Major depressive disorder, single episode, unspecified: Secondary | ICD-10-CM

## 2019-09-28 DIAGNOSIS — F32A Depression, unspecified: Secondary | ICD-10-CM

## 2019-10-09 ENCOUNTER — Other Ambulatory Visit: Payer: Self-pay | Admitting: Family Medicine

## 2019-10-09 DIAGNOSIS — Z30018 Encounter for initial prescription of other contraceptives: Secondary | ICD-10-CM

## 2019-12-27 ENCOUNTER — Other Ambulatory Visit: Payer: Self-pay | Admitting: Family Medicine

## 2019-12-27 DIAGNOSIS — F329 Major depressive disorder, single episode, unspecified: Secondary | ICD-10-CM

## 2019-12-27 DIAGNOSIS — F32A Depression, unspecified: Secondary | ICD-10-CM

## 2019-12-27 NOTE — Telephone Encounter (Signed)
Called pt and advised she needs to call back to schedule an appt within the next 30-45 days

## 2019-12-27 NOTE — Telephone Encounter (Signed)
Needs visit in the next 30-45 days.

## 2019-12-27 NOTE — Telephone Encounter (Signed)
Is this okay to refill? 

## 2020-01-11 ENCOUNTER — Encounter: Payer: Self-pay | Admitting: Family Medicine

## 2020-01-11 ENCOUNTER — Other Ambulatory Visit: Payer: Self-pay

## 2020-01-11 ENCOUNTER — Ambulatory Visit: Payer: 59 | Admitting: Family Medicine

## 2020-01-11 VITALS — Ht 66.5 in | Wt 160.0 lb

## 2020-01-11 DIAGNOSIS — F329 Major depressive disorder, single episode, unspecified: Secondary | ICD-10-CM | POA: Diagnosis not present

## 2020-01-11 DIAGNOSIS — F32A Depression, unspecified: Secondary | ICD-10-CM

## 2020-01-11 DIAGNOSIS — Z79899 Other long term (current) drug therapy: Secondary | ICD-10-CM

## 2020-01-11 MED ORDER — VENLAFAXINE HCL ER 75 MG PO CP24: ORAL_CAPSULE | ORAL | 1 refills | Status: DC

## 2020-01-11 NOTE — Progress Notes (Signed)
   Subjective:  Documentation for virtual audio and video telecommunications through [place] encounter:  The patient was located in her office. 2 patient identifiers used.  The provider was located in the office. The patient did consent to this visit and is aware of possible charges through their insurance for this visit.  The other persons participating in this telemedicine service were none.    Patient ID: Yesenia Johnson, female    DOB: Feb 27, 1986, 34 y.o.   MRN: [id number]  HPI Chief Complaint  Patient presents with  . Depression    VIRTUAL med check. No new concerns.    This is a virtual medication management visit. She reports doing well on Effexor. States even with recent increase of stress she is still managing fine.  Is not currently seeing a therapist.   No new concerns today.     Review of Systems Pertinent positives and negatives in the history of present illness.     Objective:   Physical Exam Ht 5' 6.5" (1.689 m)   Wt 160 lb (72.6 kg)   LMP 12/13/2019 (Approximate)   BMI 25.44 kg/m   Alert and oriented and in no acute distress.  Respirations unlabored.  Mood is good.      Assessment & Plan:  Depression, unspecified depression type - Plan: venlafaxine XR (EFFEXOR-XR) 75 MG 24 hr capsule  Medication management  This is a virtual follow-up on depression.  She is in her usual state of health.  She has been on the current dose of Effexor for several months and reports doing quite well.  She is not currently seeing a therapist and does not feel that she needs to. Reviewed recent lab results and visit notes. Discussed scheduling a CPE in 4 to 6 months and will follow up with chronic health conditions at that time as well. Time spent on call was 10 minutes and in review of previous records 15 minutes total.  This virtual service is not related to other E/M service within previous 7 days.

## 2020-02-24 ENCOUNTER — Ambulatory Visit: Payer: Self-pay

## 2020-02-25 ENCOUNTER — Ambulatory Visit: Payer: Self-pay | Attending: Internal Medicine

## 2020-02-25 DIAGNOSIS — Z23 Encounter for immunization: Secondary | ICD-10-CM

## 2020-02-25 NOTE — Progress Notes (Signed)
   Covid-19 Vaccination Clinic  Name:  Yesenia Johnson    MRN: [id number] DOB: 1985-12-27  02/25/2020  [name] was observed post Covid-19 immunization for 15 minutes without incident. She was provided with Vaccine Information Sheet and instruction to access the V-Safe system.   [name] was instructed to call 911 with any severe reactions post vaccine: Marland Kitchen Difficulty breathing  . Swelling of face and throat  . A fast heartbeat  . A bad rash all over body  . Dizziness and weakness   Immunizations Administered    Name Date Dose VIS Date Route   Pfizer COVID-19 Vaccine 02/25/2020  4:49 PM 0.3 mL 11/04/2019 Intramuscular   Manufacturer: [employer], [employer]   Lot: [id number]   NDC: [address]-5

## 2020-03-20 ENCOUNTER — Ambulatory Visit: Payer: 59 | Attending: Internal Medicine

## 2020-03-20 DIAGNOSIS — Z23 Encounter for immunization: Secondary | ICD-10-CM

## 2020-03-20 NOTE — Progress Notes (Signed)
   Covid-19 Vaccination Clinic  Name:  Yesenia Johnson    MRN: [id number] DOB: 03-31-1986  03/20/2020  [name] was observed post Covid-19 immunization for 15 minutes without incident. She was provided with Vaccine Information Sheet and instruction to access the V-Safe system.   [name] was instructed to call 911 with any severe reactions post vaccine: Marland Kitchen Difficulty breathing  . Swelling of face and throat  . A fast heartbeat  . A bad rash all over body  . Dizziness and weakness   Immunizations Administered    Name Date Dose VIS Date Route   Pfizer COVID-19 Vaccine 03/20/2020 11:12 AM 0.3 mL 01/18/2019 Intramuscular   Manufacturer: [employer], [employer]   Lot: [id number]   NDC: [address]-7

## 2020-03-21 ENCOUNTER — Other Ambulatory Visit: Payer: Self-pay

## 2020-03-21 ENCOUNTER — Emergency Department (HOSPITAL_COMMUNITY)
Admission: EM | Admit: 2020-03-21 | Discharge: 2020-03-22 | Disposition: A | Discharge status: Home / Self Care | Payer: 59 | Source: Home / Self Care

## 2020-03-21 DIAGNOSIS — K828 Other specified diseases of gallbladder: Secondary | ICD-10-CM | POA: Diagnosis not present

## 2020-03-21 DIAGNOSIS — Z880 Allergy status to penicillin: Secondary | ICD-10-CM | POA: Diagnosis not present

## 2020-03-21 DIAGNOSIS — Z8249 Family history of ischemic heart disease and other diseases of the circulatory system: Secondary | ICD-10-CM | POA: Diagnosis not present

## 2020-03-21 DIAGNOSIS — Z20822 Contact with and (suspected) exposure to covid-19: Secondary | ICD-10-CM | POA: Diagnosis not present

## 2020-03-21 DIAGNOSIS — Z811 Family history of alcohol abuse and dependence: Secondary | ICD-10-CM | POA: Diagnosis not present

## 2020-03-21 DIAGNOSIS — R0789 Other chest pain: Secondary | ICD-10-CM | POA: Insufficient documentation

## 2020-03-21 DIAGNOSIS — K5900 Constipation, unspecified: Secondary | ICD-10-CM | POA: Diagnosis not present

## 2020-03-21 DIAGNOSIS — Z5321 Procedure and treatment not carried out due to patient leaving prior to being seen by health care provider: Secondary | ICD-10-CM | POA: Insufficient documentation

## 2020-03-21 DIAGNOSIS — Z803 Family history of malignant neoplasm of breast: Secondary | ICD-10-CM | POA: Diagnosis not present

## 2020-03-21 DIAGNOSIS — K8000 Calculus of gallbladder with acute cholecystitis without obstruction: Secondary | ICD-10-CM | POA: Diagnosis not present

## 2020-03-21 DIAGNOSIS — R1011 Right upper quadrant pain: Secondary | ICD-10-CM | POA: Diagnosis present

## 2020-03-21 DIAGNOSIS — K810 Acute cholecystitis: Secondary | ICD-10-CM | POA: Diagnosis present

## 2020-03-21 DIAGNOSIS — Z793 Long term (current) use of hormonal contraceptives: Secondary | ICD-10-CM | POA: Diagnosis not present

## 2020-03-21 DIAGNOSIS — Z833 Family history of diabetes mellitus: Secondary | ICD-10-CM | POA: Diagnosis not present

## 2020-03-21 DIAGNOSIS — Z79899 Other long term (current) drug therapy: Secondary | ICD-10-CM | POA: Diagnosis not present

## 2020-03-21 DIAGNOSIS — F329 Major depressive disorder, single episode, unspecified: Secondary | ICD-10-CM | POA: Diagnosis not present

## 2020-03-21 NOTE — ED Triage Notes (Signed)
Pt reports she was laying in bed, started to have back pain, which radiated to her right flank to her abdomen and then her chest.  Pt is diaphoretic now and states her hand and feet are tingly which she attributes to her anxiety about what is going on.

## 2020-03-22 ENCOUNTER — Emergency Department (HOSPITAL_COMMUNITY): Payer: 59

## 2020-03-22 ENCOUNTER — Encounter (HOSPITAL_COMMUNITY): Payer: Self-pay | Admitting: Emergency Medicine

## 2020-03-22 LAB — CBC
HCT: 38.5 % (ref 36.0–46.0)
Hemoglobin: 13 g/dL (ref 12.0–15.0)
MCH: 30.1 pg (ref 26.0–34.0)
MCHC: 33.8 g/dL (ref 30.0–36.0)
MCV: 89.1 fL (ref 80.0–100.0)
Platelets: 369 10*3/uL (ref 150–400)
RBC: 4.32 MIL/uL (ref 3.87–5.11)
RDW: 11.8 % (ref 11.5–15.5)
WBC: 7.5 10*3/uL (ref 4.0–10.5)
nRBC: 0 % (ref 0.0–0.2)

## 2020-03-22 LAB — TROPONIN I (HIGH SENSITIVITY): Troponin I (High Sensitivity): <2 ng/L (ref <18)

## 2020-03-22 LAB — BASIC METABOLIC PANEL
Anion gap: 13 (ref 5–15)
BUN: 12 mg/dL (ref 6–20)
CO2: 17 mmol/L — ABNORMAL LOW (ref 22–32)
Calcium: 8.8 mg/dL — ABNORMAL LOW (ref 8.9–10.3)
Chloride: 107 mmol/L (ref 98–111)
Creatinine, Ser: 0.74 mg/dL (ref 0.44–1.00)
GFR calc Af Amer: >60 mL/min (ref >60)
GFR calc non Af Amer: >60 mL/min (ref >60)
Glucose, Bld: 162 mg/dL — ABNORMAL HIGH (ref 70–99)
Potassium: 3.2 mmol/L — ABNORMAL LOW (ref 3.5–5.1)
Sodium: 137 mmol/L (ref 135–145)

## 2020-03-22 LAB — I-STAT BETA HCG BLOOD, ED (MC, WL, AP ONLY): I-stat hCG, quantitative: <5 m[IU]/mL (ref <5)

## 2020-03-22 MED ORDER — SODIUM CHLORIDE 0.9% FLUSH: 3.0000 mL | Freq: Once | INTRAVENOUS | Status: DC

## 2020-03-22 NOTE — ED Notes (Signed)
Pt was advised not to leave.Pt states she wasn't in anymore pain and she was going home.

## 2020-03-23 ENCOUNTER — Observation Stay (HOSPITAL_BASED_OUTPATIENT_CLINIC_OR_DEPARTMENT_OTHER)
Admission: EM | Admit: 2020-03-23 | Discharge: 2020-03-25 | Disposition: A | Discharge status: Home / Self Care | Payer: 59 | Attending: Surgery | Admitting: Surgery

## 2020-03-23 ENCOUNTER — Encounter: Payer: Self-pay | Admitting: Family Medicine

## 2020-03-23 ENCOUNTER — Emergency Department (HOSPITAL_BASED_OUTPATIENT_CLINIC_OR_DEPARTMENT_OTHER): Payer: 59

## 2020-03-23 ENCOUNTER — Encounter (HOSPITAL_BASED_OUTPATIENT_CLINIC_OR_DEPARTMENT_OTHER): Payer: Self-pay | Admitting: *Deleted

## 2020-03-23 ENCOUNTER — Ambulatory Visit: Payer: 59 | Admitting: Family Medicine

## 2020-03-23 ENCOUNTER — Other Ambulatory Visit: Payer: Self-pay

## 2020-03-23 VITALS — BP 128/88 | HR 79 | Temp 97.8°F | Wt 163.4 lb

## 2020-03-23 DIAGNOSIS — R1011 Right upper quadrant pain: Secondary | ICD-10-CM

## 2020-03-23 DIAGNOSIS — Z833 Family history of diabetes mellitus: Secondary | ICD-10-CM | POA: Insufficient documentation

## 2020-03-23 DIAGNOSIS — F329 Major depressive disorder, single episode, unspecified: Secondary | ICD-10-CM | POA: Insufficient documentation

## 2020-03-23 DIAGNOSIS — Z811 Family history of alcohol abuse and dependence: Secondary | ICD-10-CM | POA: Insufficient documentation

## 2020-03-23 DIAGNOSIS — Z20822 Contact with and (suspected) exposure to covid-19: Secondary | ICD-10-CM | POA: Insufficient documentation

## 2020-03-23 DIAGNOSIS — K8000 Calculus of gallbladder with acute cholecystitis without obstruction: Principal | ICD-10-CM | POA: Insufficient documentation

## 2020-03-23 DIAGNOSIS — K8020 Calculus of gallbladder without cholecystitis without obstruction: Secondary | ICD-10-CM

## 2020-03-23 DIAGNOSIS — K810 Acute cholecystitis: Secondary | ICD-10-CM

## 2020-03-23 DIAGNOSIS — Z793 Long term (current) use of hormonal contraceptives: Secondary | ICD-10-CM | POA: Insufficient documentation

## 2020-03-23 DIAGNOSIS — Z803 Family history of malignant neoplasm of breast: Secondary | ICD-10-CM | POA: Insufficient documentation

## 2020-03-23 DIAGNOSIS — Z880 Allergy status to penicillin: Secondary | ICD-10-CM | POA: Insufficient documentation

## 2020-03-23 DIAGNOSIS — K828 Other specified diseases of gallbladder: Secondary | ICD-10-CM | POA: Insufficient documentation

## 2020-03-23 DIAGNOSIS — Z79899 Other long term (current) drug therapy: Secondary | ICD-10-CM | POA: Insufficient documentation

## 2020-03-23 DIAGNOSIS — Z8249 Family history of ischemic heart disease and other diseases of the circulatory system: Secondary | ICD-10-CM | POA: Insufficient documentation

## 2020-03-23 DIAGNOSIS — K5900 Constipation, unspecified: Secondary | ICD-10-CM | POA: Insufficient documentation

## 2020-03-23 LAB — CBC WITH DIFFERENTIAL/PLATELET
Abs Immature Granulocytes: 0.08 10*3/uL — ABNORMAL HIGH (ref 0.00–0.07)
Basophils Absolute: 0.1 10*3/uL (ref 0.0–0.1)
Basophils Relative: 0 %
Eosinophils Absolute: 0.1 10*3/uL (ref 0.0–0.5)
Eosinophils Relative: 1 %
HCT: 39.4 % (ref 36.0–46.0)
Hemoglobin: 13.9 g/dL (ref 12.0–15.0)
Immature Granulocytes: 1 %
Lymphocytes Relative: 19 %
Lymphs Abs: 2.5 10*3/uL (ref 0.7–4.0)
MCH: 31.3 pg (ref 26.0–34.0)
MCHC: 35.3 g/dL (ref 30.0–36.0)
MCV: 88.7 fL (ref 80.0–100.0)
Monocytes Absolute: 0.7 10*3/uL (ref 0.1–1.0)
Monocytes Relative: 5 %
Neutro Abs: 9.7 10*3/uL — ABNORMAL HIGH (ref 1.7–7.7)
Neutrophils Relative %: 74 %
Platelets: 369 10*3/uL (ref 150–400)
RBC: 4.44 MIL/uL (ref 3.87–5.11)
RDW: 12.1 % (ref 11.5–15.5)
WBC: 13.1 10*3/uL — ABNORMAL HIGH (ref 4.0–10.5)
nRBC: 0 % (ref 0.0–0.2)

## 2020-03-23 LAB — COMPREHENSIVE METABOLIC PANEL
ALT: 42 U/L (ref 0–44)
AST: 30 U/L (ref 15–41)
Albumin: 4.1 g/dL (ref 3.5–5.0)
Alkaline Phosphatase: 68 U/L (ref 38–126)
Anion gap: 10 (ref 5–15)
BUN: 7 mg/dL (ref 6–20)
CO2: 21 mmol/L — ABNORMAL LOW (ref 22–32)
Calcium: 8.8 mg/dL — ABNORMAL LOW (ref 8.9–10.3)
Chloride: 106 mmol/L (ref 98–111)
Creatinine, Ser: 0.7 mg/dL (ref 0.44–1.00)
GFR calc Af Amer: >60 mL/min (ref >60)
GFR calc non Af Amer: >60 mL/min (ref >60)
Glucose, Bld: 92 mg/dL (ref 70–99)
Potassium: 3.7 mmol/L (ref 3.5–5.1)
Sodium: 137 mmol/L (ref 135–145)
Total Bilirubin: 0.8 mg/dL (ref 0.3–1.2)
Total Protein: 7.8 g/dL (ref 6.5–8.1)

## 2020-03-23 LAB — RESPIRATORY PANEL BY RT PCR (FLU A&B, COVID)
Influenza A by PCR: NEGATIVE
Influenza B by PCR: NEGATIVE
SARS Coronavirus 2 by RT PCR: NEGATIVE

## 2020-03-23 LAB — URINALYSIS, ROUTINE W REFLEX MICROSCOPIC
Bilirubin Urine: NEGATIVE
Glucose, UA: NEGATIVE mg/dL
Ketones, ur: NEGATIVE mg/dL
Leukocytes,Ua: NEGATIVE
Nitrite: NEGATIVE
Protein, ur: NEGATIVE mg/dL
Specific Gravity, Urine: <1.005 — ABNORMAL LOW (ref 1.005–1.030)
pH: 6 (ref 5.0–8.0)

## 2020-03-23 LAB — URINALYSIS, MICROSCOPIC (REFLEX)

## 2020-03-23 LAB — PREGNANCY, URINE: Preg Test, Ur: NEGATIVE

## 2020-03-23 LAB — LIPASE, BLOOD: Lipase: 31 U/L (ref 11–51)

## 2020-03-23 MED ORDER — SODIUM CHLORIDE 0.9 % IV SOLN
2.0000 g | INTRAVENOUS | Status: DC
  Filled 2020-03-23: qty 20

## 2020-03-23 MED ORDER — VENLAFAXINE HCL ER 75 MG PO CP24
75.0000 mg | ORAL_CAPSULE | Freq: Every day | ORAL | Status: DC
  Administered 2020-03-24: 75 mg via ORAL
  Filled 2020-03-23: qty 1

## 2020-03-23 MED ORDER — ONDANSETRON HCL 4 MG/2ML IJ SOLN: 4.0000 mg | Freq: Four times a day (QID) | INTRAMUSCULAR | Status: DC | PRN

## 2020-03-23 MED ORDER — OXYCODONE HCL 5 MG PO TABS: 5.0000 mg | ORAL_TABLET | ORAL | Status: DC | PRN

## 2020-03-23 MED ORDER — ONDANSETRON HCL 4 MG/2ML IJ SOLN
4.0000 mg | Freq: Once | INTRAMUSCULAR | Status: AC
  Administered 2020-03-23: 4 mg via INTRAVENOUS
  Filled 2020-03-23: qty 2

## 2020-03-23 MED ORDER — SODIUM CHLORIDE 0.9 % IV SOLN
2.0000 g | Freq: Once | INTRAVENOUS | Status: AC
  Administered 2020-03-23: 2 g via INTRAVENOUS
  Filled 2020-03-23: qty 20

## 2020-03-23 MED ORDER — ACETAMINOPHEN 500 MG PO TABS
1000.0000 mg | ORAL_TABLET | Freq: Four times a day (QID) | ORAL | Status: DC
  Administered 2020-03-23 (×2): 1000 mg via ORAL
  Filled 2020-03-23 (×2): qty 2

## 2020-03-23 MED ORDER — FENTANYL CITRATE (PF) 100 MCG/2ML IJ SOLN
100.0000 ug | Freq: Once | INTRAMUSCULAR | Status: AC
  Administered 2020-03-23: 100 ug via INTRAVENOUS
  Filled 2020-03-23: qty 2

## 2020-03-23 MED ORDER — MORPHINE SULFATE (PF) 2 MG/ML IV SOLN: 1.0000 mg | INTRAVENOUS | Status: DC | PRN

## 2020-03-23 MED ORDER — ENOXAPARIN SODIUM 40 MG/0.4ML ~~LOC~~ SOLN
40.0000 mg | SUBCUTANEOUS | Status: DC
  Administered 2020-03-23: 40 mg via SUBCUTANEOUS
  Filled 2020-03-23: qty 0.4

## 2020-03-23 MED ORDER — METOPROLOL TARTRATE 5 MG/5ML IV SOLN: 5.0000 mg | Freq: Four times a day (QID) | INTRAVENOUS | Status: DC | PRN

## 2020-03-23 MED ORDER — KETOROLAC TROMETHAMINE 30 MG/ML IJ SOLN
30.0000 mg | Freq: Three times a day (TID) | INTRAMUSCULAR | Status: DC | PRN
  Administered 2020-03-24: 06:00:00 30 mg via INTRAVENOUS
  Filled 2020-03-23: qty 1

## 2020-03-23 MED ORDER — ONDANSETRON 4 MG PO TBDP: 4.0000 mg | ORAL_TABLET | Freq: Four times a day (QID) | ORAL | Status: DC | PRN

## 2020-03-23 MED ORDER — DIPHENHYDRAMINE HCL 50 MG/ML IJ SOLN: 25.0000 mg | Freq: Four times a day (QID) | INTRAMUSCULAR | Status: DC | PRN

## 2020-03-23 MED ORDER — KCL IN DEXTROSE-NACL 20-5-0.45 MEQ/L-%-% IV SOLN
INTRAVENOUS | Status: DC
  Filled 2020-03-23 (×2): qty 1000

## 2020-03-23 MED ORDER — DIPHENHYDRAMINE HCL 25 MG PO CAPS: 25.0000 mg | ORAL_CAPSULE | Freq: Four times a day (QID) | ORAL | Status: DC | PRN

## 2020-03-23 NOTE — Patient Instructions (Signed)
Attention to the foods in terms of milk and milk products, greasy foods spicy foods to see if there is a pattern.  Always start with Tylenol first for the pain and then you can use Advil or Aleve

## 2020-03-23 NOTE — ED Provider Notes (Signed)
[facility] DEPARTMENT Provider Note   CSN: [id number] Arrival date & time: 03/23/20  1202     History Chief Complaint  Patient presents with  . Abdominal Pain    Yesenia Johnson is a 34 y.o. female.  34 year old female presents with complaint of right upper quadrant abdominal pain onset 2 days ago, noted to start in her back and radiate around to her right upper quadrant.  Patient became concerned that night when pain seemed to radiate up into her chest and went to the [facility], ED.  Patient had an EKG and lab work, of her prolonged waiting time her pain seemed to improve and she decided to leave.  Patient went to her PCP this morning with ongoing intermittent pain, PCP ordered a right upper quadrant ultrasound however pain has become progressively worse and patient did not want to wait for her outpatient ultrasound.  Patient reports loss of appetite, states thinking of food makes her pain feel worse, associated with nausea, no vomiting.  Denies fevers, chills, sweats.  No changes in bowel or bladder habits.  Patient did try taking Gas-X and OTC medications without improvement in her pain.        Past Medical History:  Diagnosis Date  . Depression     Patient Active Problem List   Diagnosis Date Noted  . Dysuria 06/14/2018  . Abnormal urinalysis 06/14/2018  . Counseling on health promotion and disease prevention 01/21/2018  . Nutritional counseling 01/21/2018  . Depression 06/16/2017  . Overweight (BMI 25.0-29.9) 06/16/2017  . Environmental and seasonal allergies 06/16/2017  . Family history of diabetes mellitus in mother 06/16/2017  . Family history of hypertension- mom 06/16/2017  . Family history of breast cancer- in M aunt in her early 24's 06/16/2017  . Pap smear of cervix shows high risk HPV present- in past, had colposcopy, 06/16/2017    Past Surgical History:  Procedure Laterality Date  . COLPOSCOPY    . WISDOM TOOTH EXTRACTION  11/2016      OB History   No obstetric history on file.     Family History  Problem Relation Age of Onset  . Hypertension Mother   . Diabetes Mother   . Stroke Maternal Grandmother   . Diabetes Paternal Grandmother   . Breast cancer Maternal Aunt   . Alcoholism Maternal Aunt   . Diabetes Paternal Aunt     Social History   Tobacco Use  . Smoking status: Never Smoker  . Smokeless tobacco: Never Used  Substance Use Topics  . Alcohol use: Yes    Alcohol/week: 2.0 standard drinks    Types: 2 Cans of beer per week    Comment: 3-4 drinks per week   . Drug use: No    Home Medications Prior to Admission medications   Medication Sig Start Date End Date Taking? Authorizing Provider  etonogestrel-ethinyl estradiol (NUVARING) 0.12-0.015 MG/24HR vaginal ring INSERT 1 RING VAGINALLY AS DIRECTED. REMOVE AFTER 3 WEEKS & WAIT 7 DAYS BEFORE INSERTING A NEW RING 10/10/19   Henson, Vickie L, NP-C  venlafaxine XR (EFFEXOR-XR) 75 MG 24 hr capsule TAKE 1 CAPSULE BY MOUTH EVERY DAY WITH BREAKFAST 01/11/20   Henson, Vickie L, NP-C    Allergies    Penicillins  Review of Systems   Review of Systems  Constitutional: Negative for chills, diaphoresis and fever.  Respiratory: Negative for shortness of breath.   Cardiovascular: Negative for chest pain.  Gastrointestinal: Positive for abdominal pain and nausea. Negative for constipation,  diarrhea and vomiting.  Genitourinary: Negative for difficulty urinating and dysuria.  Musculoskeletal: Positive for back pain.  Skin: Negative for rash and wound.  Allergic/Immunologic: Negative for immunocompromised state.  All other systems reviewed and are negative.   Physical Exam Updated Vital Signs BP (!) 144/92   Pulse 81   Temp 98.1 F (36.7 C) (Oral)   Resp 18   Ht 5' 6"  (1.676 m)   Wt 73.9 kg   SpO2 99%   BMI 26.31 kg/m   Physical Exam Vitals and nursing note reviewed.  Constitutional:      General: She is not in acute distress.    Appearance:  She is well-developed. She is not diaphoretic.  HENT:     Head: Normocephalic and atraumatic.  Cardiovascular:     Rate and Rhythm: Normal rate and regular rhythm.     Heart sounds: Normal heart sounds.  Pulmonary:     Effort: Pulmonary effort is normal.     Breath sounds: Normal breath sounds.  Abdominal:     Palpations: Abdomen is soft.     Tenderness: There is abdominal tenderness in the right upper quadrant and right lower quadrant. There is guarding. There is no right CVA tenderness or left CVA tenderness. Positive signs include Demarie Hyneman's sign.     Comments: Right upper quadrant tender more so than right lower quadrant, positive Adem Costlow sign  Skin:    General: Skin is warm and dry.     Findings: No erythema or rash.  Neurological:     Mental Status: She is alert and oriented to person, place, and time.  Psychiatric:        Behavior: Behavior normal.     ED Results / Procedures / Treatments   Labs (all labs ordered are listed, but only abnormal results are displayed) Labs Reviewed  URINALYSIS, ROUTINE W REFLEX MICROSCOPIC - Abnormal; Notable for the following components:      Result Value   Color, Urine STRAW (*)    Specific Gravity, Urine <1.005 (*)    Hgb urine dipstick TRACE (*)    All other components within normal limits  CBC WITH DIFFERENTIAL/PLATELET - Abnormal; Notable for the following components:   WBC 13.1 (*)    Neutro Abs 9.7 (*)    Abs Immature Granulocytes 0.08 (*)    All other components within normal limits  COMPREHENSIVE METABOLIC PANEL - Abnormal; Notable for the following components:   CO2 21 (*)    Calcium 8.8 (*)    All other components within normal limits  URINALYSIS, MICROSCOPIC (REFLEX) - Abnormal; Notable for the following components:   Bacteria, UA FEW (*)    All other components within normal limits  RESPIRATORY PANEL BY RT PCR (FLU A&B, COVID)  PREGNANCY, URINE  LIPASE, BLOOD    EKG None  Radiology DG Chest 2 View  Result Date:  03/22/2020 CLINICAL DATA:  Initial evaluation for acute chest pain. EXAM: CHEST - 2 VIEW COMPARISON:  None available. FINDINGS: The cardiac and mediastinal silhouettes are within normal limits. The lungs are normally inflated. No airspace consolidation, pleural effusion, or pulmonary edema. No pneumothorax. No acute osseous abnormality. IMPRESSION: No active cardiopulmonary disease. Electronically Signed   By: Jeannine Boga M.D.   On: 03/22/2020 00:28   US Abdomen Limited RUQ  Result Date: 03/23/2020 CLINICAL DATA:  RIGHT upper quadrant pain for 2 days. EXAM: ULTRASOUND ABDOMEN LIMITED RIGHT UPPER QUADRANT COMPARISON:  None. FINDINGS: Gallbladder: Multiple gallstones, largest measured stone is 16 mm. Additional sludge  within the gallbladder. Markedly thickened gallbladder walls, measuring up to 11 mm thickness. Positive sonographic Rocio Wolak's sign elicited during the exam. Pericholecystic fluid is present. Common bile duct: Diameter: 5 mm Liver: No focal lesion identified. Within normal limits in parenchymal echogenicity. Portal vein is patent on color Doppler imaging with normal direction of blood flow towards the liver. Other: None. IMPRESSION: 1. Findings are consistent with acute cholecystitis with markedly thickened and edematous gallbladder walls, pericholecystic fluid and a positive sonographic Naftula Donahue's sign. 2. Cholelithiasis, largest measures stone being 16 mm. 3. No bile duct dilatation. Electronically Signed   By: Franki Cabot M.D.   On: 03/23/2020 13:38    Procedures Procedures (including critical care time)  Medications Ordered in ED Medications  cefTRIAXone (ROCEPHIN) 2 g in sodium chloride 0.9 % 100 mL IVPB (2 g Intravenous New Bag/Given 03/23/20 1408)    ED Course  I have reviewed the triage vital signs and the nursing notes.  Pertinent labs & imaging results that were available during my care of the patient were reviewed by me and considered in my medical decision making (see  chart for details).  Clinical Course as of Mar 23 1421  Fri Mar 23, 2070  2720 34 year old female with complaint of right upper quadrant pain for the past 2 days with loss of appetite.  On exam has positive Betina Puckett sign, right side abdominal tenderness worse in the right upper quadrant. Lab work shows elevated leukocytosis at 13,000 with increase in neutrophils, CMP without significant findings including normal LFTs, alk phos, total bili.  Urinalysis with trace hemoglobin, hCG negative. Ultrasound of right upper quadrant shows acute cholecystitis. Case discussed with Dr. Harlow Asa on-call with general surgery at [facility], requests patient transferred to the [facility], ED.  I have ordered Rocephin for her, patient will go by private vehicle with family member driving her.   [LM]  1422 Contacted Dr. Tomi Bamberger, ER attending at [facility] long ED, aware of patient's pending arrival.   [LM]    Clinical Course User Index [LM] Roque Lias   MDM Rules/Calculators/A&P                      Final Clinical Impression(s) / ED Diagnoses Final diagnoses:  Acute cholecystitis    Rx / DC Orders ED Discharge Orders    None       Tacy Learn, PA-C 03/23/20 1423    Charlesetta Shanks, MD 03/29/20 1410

## 2020-03-23 NOTE — ED Triage Notes (Signed)
Abdominal pain x 2 days. Right upper quad pain. She went to the ED at [facility], the pain resolved, she left without being seen.

## 2020-03-23 NOTE — ED Provider Notes (Signed)
Patient transferred from [facility] for eval by gen surg.  GEN surge will see patient.  Patient was some abdominal tenderness right upper quadrant.  We will plan for pain medications.  Last NPO was this morning when she drank a smoothie.  U/S consistent with acute cholecystitis with thickened and edematous gallbladder walls. She has evidence of gallstones up to 52mm.   Gen surg will admit patient.   Results for orders placed or performed during the hospital encounter of 03/23/20 (from the past 24 hour(s))  Urinalysis, Routine w reflex microscopic     Status: Abnormal   Collection Time: 03/23/20 12:40 PM  Result Value Ref Range   Color, Urine STRAW (A) YELLOW   APPearance CLEAR CLEAR   Specific Gravity, Urine <1.005 (L) 1.005 - 1.030   pH 6.0 5.0 - 8.0   Glucose, UA NEGATIVE NEGATIVE mg/dL   Hgb urine dipstick TRACE (A) NEGATIVE   Bilirubin Urine NEGATIVE NEGATIVE   Ketones, ur NEGATIVE NEGATIVE mg/dL   Protein, ur NEGATIVE NEGATIVE mg/dL   Nitrite NEGATIVE NEGATIVE   Leukocytes,Ua NEGATIVE NEGATIVE  Pregnancy, urine     Status: None   Collection Time: 03/23/20 12:40 PM  Result Value Ref Range   Preg Test, Ur NEGATIVE NEGATIVE  Urinalysis, Microscopic (reflex)     Status: Abnormal   Collection Time: 03/23/20 12:40 PM  Result Value Ref Range   RBC / HPF 0-5 0 - 5 RBC/hpf   WBC, UA 0-5 0 - 5 WBC/hpf   Bacteria, UA FEW (A) NONE SEEN   Squamous Epithelial / LPF 0-5 0 - 5  CBC with Differential     Status: Abnormal   Collection Time: 03/23/20  1:10 PM  Result Value Ref Range   WBC 13.1 (H) 4.0 - 10.5 K/uL   RBC 4.44 3.87 - 5.11 MIL/uL   Hemoglobin 13.9 12.0 - 15.0 g/dL   HCT 39.4 36.0 - 46.0 %   MCV 88.7 80.0 - 100.0 fL   MCH 31.3 26.0 - 34.0 pg   MCHC 35.3 30.0 - 36.0 g/dL   RDW 12.1 11.5 - 15.5 %   Platelets 369 150 - 400 K/uL   nRBC 0.0 0.0 - 0.2 %   Neutrophils Relative % 74 %   Neutro Abs 9.7 (H) 1.7 - 7.7 K/uL   Lymphocytes Relative 19 %   Lymphs Abs 2.5 0.7 - 4.0 K/uL    Monocytes Relative 5 %   Monocytes Absolute 0.7 0.1 - 1.0 K/uL   Eosinophils Relative 1 %   Eosinophils Absolute 0.1 0.0 - 0.5 K/uL   Basophils Relative 0 %   Basophils Absolute 0.1 0.0 - 0.1 K/uL   Immature Granulocytes 1 %   Abs Immature Granulocytes 0.08 (H) 0.00 - 0.07 K/uL  Comprehensive metabolic panel     Status: Abnormal   Collection Time: 03/23/20  1:10 PM  Result Value Ref Range   Sodium 137 135 - 145 mmol/L   Potassium 3.7 3.5 - 5.1 mmol/L   Chloride 106 98 - 111 mmol/L   CO2 21 (L) 22 - 32 mmol/L   Glucose, Bld 92 70 - 99 mg/dL   BUN 7 6 - 20 mg/dL   Creatinine, Ser 0.70 0.44 - 1.00 mg/dL   Calcium 8.8 (L) 8.9 - 10.3 mg/dL   Total Protein 7.8 6.5 - 8.1 g/dL   Albumin 4.1 3.5 - 5.0 g/dL   AST 30 15 - 41 U/L   ALT 42 0 - 44 U/L   Alkaline Phosphatase  68 38 - 126 U/L   Total Bilirubin 0.8 0.3 - 1.2 mg/dL   GFR calc non Af Amer >60 >60 mL/min   GFR calc Af Amer >60 >60 mL/min   Anion gap 10 5 - 15  Lipase, blood     Status: None   Collection Time: 03/23/20  1:10 PM  Result Value Ref Range   Lipase 31 11 - 51 U/L  Respiratory Panel by RT PCR (Flu A&B, Covid) - Nasopharyngeal Swab     Status: None   Collection Time: 03/23/20  2:00 PM   Specimen: Nasopharyngeal Swab  Result Value Ref Range   SARS Coronavirus 2 by RT PCR NEGATIVE NEGATIVE   Influenza A by PCR NEGATIVE NEGATIVE   Influenza B by PCR NEGATIVE NEGATIVE          Portions of this note were generated with Dragon dictation software. Dictation errors may occur despite best attempts at proofreading.     Maxwell Caul, PA-C 03/23/20 [id number]    Gwyneth Sprout, MD 03/23/20 2026

## 2020-03-23 NOTE — Progress Notes (Signed)
   Subjective:    Patient ID: Yesenia Johnson, female    DOB: [birth year], 34 y.o.   MRN: [id number]  HPI 2 days ago she had the onset of back pain that radiated into the right upper quadrant.  She went to the emergency room.  Blood work, EKG and chest x-ray was done.  She left before being seen as she states the pain went away.  She has a previous history of difficulty with abdominal pain dating back to her childhood.  Apparently she did have various procedures done, none of which were positive.  There also is a family history of gallbladder disease.  She cannot truly relate this pain to any particular foods but apparently has not tried to make the correlation.   Review of Systems     Objective:   Physical Exam Alert and complaining of right upper quadrant pain.  Bowel sounds are active.  Positive Murphy's sign and Murphy's punch. The blood work, EKG and chest x-ray from the emergency room was independently evaluated by me.  The tests were negative.      Assessment & Plan:  Right upper quadrant pain - Plan: US Abdomen Limited RUQ Her present symptoms sound like they could be gallbladder disease although her previous history makes it sound more like IBS.  Recommend Tylenol and NSAID for pain relief and also pay attention to any particular foods or spices that might make this worse.  She might eventually need to be referred to GI.  Gluten intolerance is also a possibility.

## 2020-03-23 NOTE — ED Notes (Addendum)
Pt for transfer to [facility] ED for surgical consult with dx Cholecystitis.  To transfer via private vehicle, approved by provider.  Mom to transport, understands to maintain NPO and go directly to [facility] ED.  Pt to transport as soon as IV abt complete.  Accepted by Dr Lynelle Doctor at [facility] ED.

## 2020-03-23 NOTE — H&P (Signed)
Yesenia Johnson Yesenia Johnson August 01, 1986  [id number].    Chief Complaint/Reason for Consult: cholecystitis  HPI:  This is a 34 yo white female with a history of depression who began having right-sided back pain that radiated around to her RUQ and ultimately her chest on Wednesday.  She initially had N/V as well as gas and bloating.  She was concerned since her pain was in her chest and went to the ED at [facility].  The wait time was very long and her pain subsided and so she went home without being seen.  Unfortunately the following day, her pain returned and continued to worsen.  She had a a virtual visit with her PCP who set her up for an outpatient [place] but she couldn't make it as her pain got significantly worse.  She denies any fevers or chills.  She tried something for constipation but that didn't work either.  She presented to [facility] ED today and was found to have a WBC of 13K and an Korea c/w cholecystitis with gallbladder wall thickening and pericholecystic fluid.  She was tx to [facility] for our evaluation and admission.  ROS: ROS: Please see HPI, otherwise all other systems have been reviewed and are negative.  Family History  Problem Relation Age of Onset  . Hypertension Mother   . Diabetes Mother   . Stroke Maternal Grandmother   . Diabetes Paternal Grandmother   . Breast cancer Maternal Aunt   . Alcoholism Maternal Aunt   . Diabetes Paternal Aunt     Past Medical History:  Diagnosis Date  . Depression     Past Surgical History:  Procedure Laterality Date  . COLPOSCOPY    . WISDOM TOOTH EXTRACTION  11/2016    Social History:  reports that she has never smoked. She has never used smokeless tobacco. She reports current alcohol use of about 2.0 standard drinks of alcohol per week. She reports that she does not use drugs.  Allergies:  Allergies  Allergen Reactions  . Penicillins Hives    (Not in a hospital admission)    Physical Exam: Blood pressure (!) 142/83, pulse 77, temperature 98.4  F (36.9 C), temperature source Oral, resp. rate 18, height 5\' 6"  (1.676 m), weight 73.9 kg, SpO2 100 %. General: pleasant, WD, WN white female who is laying in bed in NAD HEENT: head is normocephalic, atraumatic.  Sclera are noninjected.  PERRL.  Ears and nose without any masses or lesions.  Mouth is pink and moist Heart: regular, rate, and rhythm.  Normal s1,s2. No obvious murmurs, gallops, or rubs noted.  Palpable radial and pedal pulses bilaterally Lungs: CTAB, no wheezes, rhonchi, or rales noted.  Respiratory effort nonlabored Abd: soft, very tender in RUQ with +Murphy's sign, ND, +BS, no masses, hernias, or organomegaly MS: all 4 extremities are symmetrical with no cyanosis, clubbing, or edema. Skin: warm and dry with no masses, lesions, or rashes Neuro: Cranial nerves 2-12 grossly intact, sensation is normal throughout Psych: A&Ox3 with an appropriate affect.   Results for orders placed or performed during the hospital encounter of 03/23/20 (from the past 48 hour(s))  Urinalysis, Routine w reflex microscopic     Status: Abnormal   Collection Time: 03/23/20 12:40 PM  Result Value Ref Range   Color, Urine STRAW (A) YELLOW   APPearance CLEAR CLEAR   Specific Gravity, Urine <1.005 (L) 1.005 - 1.030   pH 6.0 5.0 - 8.0   Glucose, UA NEGATIVE NEGATIVE mg/dL   Hgb urine  dipstick TRACE (A) NEGATIVE   Bilirubin Urine NEGATIVE NEGATIVE   Ketones, ur NEGATIVE NEGATIVE mg/dL   Protein, ur NEGATIVE NEGATIVE mg/dL   Nitrite NEGATIVE NEGATIVE   Leukocytes,Ua NEGATIVE NEGATIVE    Comment: Performed at [facility], [address] [place], [place] [address]  Pregnancy, urine     Status: None   Collection Time: 03/23/20 12:40 PM  Result Value Ref Range   Preg Test, Ur NEGATIVE NEGATIVE    Comment:        THE SENSITIVITY OF THIS METHODOLOGY IS >20 mIU/mL. Performed at [facility], [address] [place], [place] [address]   Urinalysis, Microscopic (reflex)      Status: Abnormal   Collection Time: 03/23/20 12:40 PM  Result Value Ref Range   RBC / HPF 0-5 0 - 5 RBC/hpf   WBC, UA 0-5 0 - 5 WBC/hpf   Bacteria, UA FEW (A) NONE SEEN   Squamous Epithelial / LPF 0-5 0 - 5    Comment: Performed at [facility], [address] [place], [place] [address]  CBC with Differential     Status: Abnormal   Collection Time: 03/23/20  1:10 PM  Result Value Ref Range   WBC 13.1 (H) 4.0 - 10.5 K/uL   RBC 4.44 3.87 - 5.11 MIL/uL   Hemoglobin 13.9 12.0 - 15.0 g/dL   HCT 39.4 36.0 - 46.0 %   MCV 88.7 80.0 - 100.0 fL   MCH 31.3 26.0 - 34.0 pg   MCHC 35.3 30.0 - 36.0 g/dL   RDW 12.1 11.5 - 15.5 %   Platelets 369 150 - 400 K/uL   nRBC 0.0 0.0 - 0.2 %   Neutrophils Relative % 74 %   Neutro Abs 9.7 (H) 1.7 - 7.7 K/uL   Lymphocytes Relative 19 %   Lymphs Abs 2.5 0.7 - 4.0 K/uL   Monocytes Relative 5 %   Monocytes Absolute 0.7 0.1 - 1.0 K/uL   Eosinophils Relative 1 %   Eosinophils Absolute 0.1 0.0 - 0.5 K/uL   Basophils Relative 0 %   Basophils Absolute 0.1 0.0 - 0.1 K/uL   Immature Granulocytes 1 %   Abs Immature Granulocytes 0.08 (H) 0.00 - 0.07 K/uL    Comment: Performed at [facility], [address] [place], [place] [address]  Comprehensive metabolic panel     Status: Abnormal   Collection Time: 03/23/20  1:10 PM  Result Value Ref Range   Sodium 137 135 - 145 mmol/L   Potassium 3.7 3.5 - 5.1 mmol/L   Chloride 106 98 - 111 mmol/L   CO2 21 (L) 22 - 32 mmol/L   Glucose, Bld 92 70 - 99 mg/dL    Comment: Glucose reference range applies only to samples taken after fasting for at least 8 hours.   BUN 7 6 - 20 mg/dL   Creatinine, Ser 0.70 0.44 - 1.00 mg/dL   Calcium 8.8 (L) 8.9 - 10.3 mg/dL   Total Protein 7.8 6.5 - 8.1 g/dL   Albumin 4.1 3.5 - 5.0 g/dL   AST 30 15 - 41 U/L   ALT 42 0 - 44 U/L   Alkaline Phosphatase 68 38 - 126 U/L   Total Bilirubin 0.8 0.3 - 1.2 mg/dL   GFR calc non Af Amer >60 >60 mL/min   GFR calc Af Amer >60  >60 mL/min   Anion gap 10 5 - 15  Comment: Performed at [facility], [address] [place], [place] [address]  Lipase, blood     Status: None   Collection Time: 03/23/20  1:10 PM  Result Value Ref Range   Lipase 31 11 - 51 U/L    Comment: Performed at [facility], [address] [place], [place] [address]  Respiratory Panel by RT PCR (Flu A&B, Covid) - Nasopharyngeal Swab     Status: None   Collection Time: 03/23/20  2:00 PM   Specimen: Nasopharyngeal Swab  Result Value Ref Range   SARS Coronavirus 2 by RT PCR NEGATIVE NEGATIVE    Comment: (NOTE) SARS-CoV-2 target nucleic acids are NOT DETECTED. The SARS-CoV-2 RNA is generally detectable in upper respiratoy specimens during the acute phase of infection. The lowest concentration of SARS-CoV-2 viral copies this assay can detect is 131 copies/mL. A negative result does not preclude SARS-Cov-2 infection and should not be used as the sole basis for treatment or other patient management decisions. A negative result may occur with  improper specimen collection/handling, submission of specimen other than nasopharyngeal swab, presence of viral mutation(s) within the areas targeted by this assay, and inadequate number of viral copies (<131 copies/mL). A negative result must be combined with clinical observations, patient history, and epidemiological information. The expected result is Negative. Fact Sheet for Patients:  https://www.moore.com/ Fact Sheet for Healthcare Providers:  https://www.young.biz/ This test is not yet ap proved or cleared by the [place] FDA and  has been authorized for detection and/or diagnosis of SARS-CoV-2 by FDA under an Emergency Use Authorization (EUA). This EUA will remain  in effect (meaning this test can be used) for the duration of the COVID-19 declaration under Section 564(b)(1) of the Act, 21 U.S.C. section 360bbb-3(b)(1), unless the  authorization is terminated or revoked sooner.    Influenza A by PCR NEGATIVE NEGATIVE   Influenza B by PCR NEGATIVE NEGATIVE    Comment: (NOTE) The Xpert Xpress SARS-CoV-2/FLU/RSV assay is intended as an aid in  the diagnosis of influenza from Nasopharyngeal swab specimens and  should not be used as a sole basis for treatment. Nasal washings and  aspirates are unacceptable for Xpert Xpress SARS-CoV-2/FLU/RSV  testing. Fact Sheet for Patients: https://www.moore.com/ Fact Sheet for Healthcare Providers: https://www.young.biz/ This test is not yet approved or cleared by the [place] FDA and  has been authorized for detection and/or diagnosis of SARS-CoV-2 by  FDA under an Emergency Use Authorization (EUA). This EUA will remain  in effect (meaning this test can be used) for the duration of the  Covid-19 declaration under Section 564(b)(1) of the Act, 21  U.S.C. section 360bbb-3(b)(1), unless the authorization is  terminated or revoked. Performed at [facility], [address], [place], [place] [address]    DG Chest 2 View  Result Date: 03/22/2020 CLINICAL DATA:  Initial evaluation for acute chest pain. EXAM: CHEST - 2 VIEW COMPARISON:  None available. FINDINGS: The cardiac and mediastinal silhouettes are within normal limits. The lungs are normally inflated. No airspace consolidation, pleural effusion, or pulmonary edema. No pneumothorax. No acute osseous abnormality. IMPRESSION: No active cardiopulmonary disease. Electronically Signed   By: Rise Mu M.D.   On: 03/22/2020 00:28   US Abdomen Limited RUQ  Result Date: 03/23/2020 CLINICAL DATA:  RIGHT upper quadrant pain for 2 days. EXAM: ULTRASOUND ABDOMEN LIMITED RIGHT UPPER QUADRANT COMPARISON:  None. FINDINGS: Gallbladder: Multiple gallstones, largest measured stone is 16 mm. Additional sludge within  the gallbladder. Markedly thickened gallbladder walls, measuring up to  11 mm thickness. Positive sonographic Murphy's sign elicited during the exam. Pericholecystic fluid is present. Common bile duct: Diameter: 5 mm Liver: No focal lesion identified. Within normal limits in parenchymal echogenicity. Portal vein is patent on color Doppler imaging with normal direction of blood flow towards the liver. Other: None. IMPRESSION: 1. Findings are consistent with acute cholecystitis with markedly thickened and edematous gallbladder walls, pericholecystic fluid and a positive sonographic Murphy's sign. 2. Cholelithiasis, largest measures stone being 16 mm. 3. No bile duct dilatation. Electronically Signed   By: Bary Richard M.D.   On: 03/23/2020 13:38      Assessment/Plan Depression - resume home med  Acute cholecystitis The patient will be admitted and started on Rocephin.  Her imaging and lab work have all been reviewed.  She will undergo lap chole with IOC tomorrow. This bas been discussed with the patient and she agrees to this procedure.   FEN - CLD/NPOpMN VTE - Lovenox ID - Rocephin Admit - inpatient  Letha Cape, [facility] 03/23/2020, 4:04 PM Please see Amion for pager number during day hours 7:00am-4:30pm or 7:00am -11:30am on weekends

## 2020-03-23 NOTE — Discharge Instructions (Addendum)
Go directly to the [facility] emergency room.  Do not eat or drink anything along the way.

## 2020-03-23 NOTE — Progress Notes (Signed)
Attempted to call ED for report on hold for 5 minutes.

## 2020-03-24 ENCOUNTER — Inpatient Hospital Stay (HOSPITAL_COMMUNITY): Payer: 59 | Admitting: Registered Nurse

## 2020-03-24 ENCOUNTER — Encounter (HOSPITAL_COMMUNITY): Payer: Self-pay

## 2020-03-24 ENCOUNTER — Encounter (HOSPITAL_COMMUNITY)
Admission: EM | Disposition: A | Discharge status: Home / Self Care | Payer: Self-pay | Source: Home / Self Care | Attending: Emergency Medicine

## 2020-03-24 ENCOUNTER — Inpatient Hospital Stay (HOSPITAL_COMMUNITY): Payer: 59

## 2020-03-24 HISTORY — PX: CHOLECYSTECTOMY: SHX55

## 2020-03-24 LAB — COMPREHENSIVE METABOLIC PANEL
ALT: 84 U/L — ABNORMAL HIGH (ref 0–44)
AST: 62 U/L — ABNORMAL HIGH (ref 15–41)
Albumin: 3.2 g/dL — ABNORMAL LOW (ref 3.5–5.0)
Alkaline Phosphatase: 81 U/L (ref 38–126)
Anion gap: 7 (ref 5–15)
BUN: 6 mg/dL (ref 6–20)
CO2: 24 mmol/L (ref 22–32)
Calcium: 8.2 mg/dL — ABNORMAL LOW (ref 8.9–10.3)
Chloride: 111 mmol/L (ref 98–111)
Creatinine, Ser: 0.75 mg/dL (ref 0.44–1.00)
GFR calc Af Amer: >60 mL/min (ref >60)
GFR calc non Af Amer: >60 mL/min (ref >60)
Glucose, Bld: 108 mg/dL — ABNORMAL HIGH (ref 70–99)
Potassium: 4.3 mmol/L (ref 3.5–5.1)
Sodium: 142 mmol/L (ref 135–145)
Total Bilirubin: 1.4 mg/dL — ABNORMAL HIGH (ref 0.3–1.2)
Total Protein: 6.6 g/dL (ref 6.5–8.1)

## 2020-03-24 LAB — CBC
HCT: 38.2 % (ref 36.0–46.0)
Hemoglobin: 12.7 g/dL (ref 12.0–15.0)
MCH: 30.7 pg (ref 26.0–34.0)
MCHC: 33.2 g/dL (ref 30.0–36.0)
MCV: 92.3 fL (ref 80.0–100.0)
Platelets: 321 10*3/uL (ref 150–400)
RBC: 4.14 MIL/uL (ref 3.87–5.11)
RDW: 12 % (ref 11.5–15.5)
WBC: 9.2 10*3/uL (ref 4.0–10.5)
nRBC: 0 % (ref 0.0–0.2)

## 2020-03-24 LAB — HIV ANTIBODY (ROUTINE TESTING W REFLEX): HIV Screen 4th Generation wRfx: NONREACTIVE

## 2020-03-24 LAB — SURGICAL PCR SCREEN
MRSA, PCR: NEGATIVE
Staphylococcus aureus: NEGATIVE

## 2020-03-24 SURGERY — LAPAROSCOPIC CHOLECYSTECTOMY WITH INTRAOPERATIVE CHOLANGIOGRAM
Anesthesia: General | Site: Abdomen

## 2020-03-24 MED ORDER — MIDAZOLAM HCL 2 MG/2ML IJ SOLN
INTRAMUSCULAR | Status: AC
  Filled 2020-03-24: qty 2

## 2020-03-24 MED ORDER — FENTANYL CITRATE (PF) 250 MCG/5ML IJ SOLN
INTRAMUSCULAR | Status: AC
  Filled 2020-03-24: qty 5

## 2020-03-24 MED ORDER — ONDANSETRON HCL 4 MG/2ML IJ SOLN
INTRAMUSCULAR | Status: AC
  Filled 2020-03-24: qty 2

## 2020-03-24 MED ORDER — FENTANYL CITRATE (PF) 100 MCG/2ML IJ SOLN
25.0000 ug | INTRAMUSCULAR | Status: DC | PRN
  Administered 2020-03-24 (×2): 50 ug via INTRAVENOUS

## 2020-03-24 MED ORDER — DEXAMETHASONE SODIUM PHOSPHATE 10 MG/ML IJ SOLN
INTRAMUSCULAR | Status: DC | PRN
  Administered 2020-03-24: 200 mg via INTRAVENOUS
  Administered 2020-03-24: 10 mg via INTRAVENOUS

## 2020-03-24 MED ORDER — OXYCODONE HCL 5 MG PO TABS: 5.0000 mg | ORAL_TABLET | Freq: Once | ORAL | Status: DC | PRN

## 2020-03-24 MED ORDER — BUPIVACAINE-EPINEPHRINE 0.5% -1:200000 IJ SOLN
INTRAMUSCULAR | Status: DC | PRN
  Administered 2020-03-24: 30 mL

## 2020-03-24 MED ORDER — 0.9 % SODIUM CHLORIDE (POUR BTL) OPTIME
TOPICAL | Status: DC | PRN
  Administered 2020-03-24: 1000 mL

## 2020-03-24 MED ORDER — PROPOFOL 10 MG/ML IV BOLUS
INTRAVENOUS | Status: DC | PRN
  Administered 2020-03-24: 200 mg via INTRAVENOUS

## 2020-03-24 MED ORDER — BUPIVACAINE-EPINEPHRINE 0.5% -1:200000 IJ SOLN
INTRAMUSCULAR | Status: AC
  Filled 2020-03-24: qty 1

## 2020-03-24 MED ORDER — ONDANSETRON HCL 4 MG/2ML IJ SOLN
INTRAMUSCULAR | Status: DC | PRN
  Administered 2020-03-24: 4 mg via INTRAVENOUS

## 2020-03-24 MED ORDER — ACETAMINOPHEN 650 MG RE SUPP
650.0000 mg | Freq: Four times a day (QID) | RECTAL | Status: DC | PRN
  Filled 2020-03-24: qty 1

## 2020-03-24 MED ORDER — ACETAMINOPHEN 325 MG PO TABS: 650.0000 mg | ORAL_TABLET | Freq: Four times a day (QID) | ORAL | Status: DC | PRN

## 2020-03-24 MED ORDER — LACTATED RINGERS IV SOLN: INTRAVENOUS | Status: DC | PRN

## 2020-03-24 MED ORDER — PROPOFOL 10 MG/ML IV BOLUS
INTRAVENOUS | Status: AC
  Filled 2020-03-24: qty 40

## 2020-03-24 MED ORDER — ACETAMINOPHEN 500 MG PO TABS: 1000.0000 mg | ORAL_TABLET | Freq: Once | ORAL | Status: DC | PRN

## 2020-03-24 MED ORDER — ACETAMINOPHEN 10 MG/ML IV SOLN
INTRAVENOUS | Status: DC | PRN
  Administered 2020-03-24: 1000 mg via INTRAVENOUS

## 2020-03-24 MED ORDER — OXYCODONE HCL 5 MG/5ML PO SOLN: 5.0000 mg | Freq: Once | ORAL | Status: DC | PRN

## 2020-03-24 MED ORDER — ONDANSETRON HCL 4 MG/2ML IJ SOLN: 4.0000 mg | Freq: Four times a day (QID) | INTRAMUSCULAR | Status: DC | PRN

## 2020-03-24 MED ORDER — FENTANYL CITRATE (PF) 100 MCG/2ML IJ SOLN
INTRAMUSCULAR | Status: DC | PRN
  Administered 2020-03-24: 50 ug via INTRAVENOUS
  Administered 2020-03-24: 100 ug via INTRAVENOUS
  Administered 2020-03-24: 50 ug via INTRAVENOUS
  Administered 2020-03-24: 100 ug via INTRAVENOUS
  Administered 2020-03-24 (×2): 50 ug via INTRAVENOUS

## 2020-03-24 MED ORDER — ARTIFICIAL TEARS OPHTHALMIC OINT
TOPICAL_OINTMENT | OPHTHALMIC | Status: AC
  Filled 2020-03-24: qty 3.5

## 2020-03-24 MED ORDER — FENTANYL CITRATE (PF) 100 MCG/2ML IJ SOLN
INTRAMUSCULAR | Status: AC
  Filled 2020-03-24: qty 2

## 2020-03-24 MED ORDER — ACETAMINOPHEN 10 MG/ML IV SOLN
INTRAVENOUS | Status: AC
  Filled 2020-03-24: qty 100

## 2020-03-24 MED ORDER — ONDANSETRON 4 MG PO TBDP: 4.0000 mg | ORAL_TABLET | Freq: Four times a day (QID) | ORAL | Status: DC | PRN

## 2020-03-24 MED ORDER — MIDAZOLAM HCL 5 MG/5ML IJ SOLN
INTRAMUSCULAR | Status: DC | PRN
  Administered 2020-03-24: 1 mg via INTRAVENOUS
  Administered 2020-03-24: 2 mg via INTRAVENOUS
  Administered 2020-03-24: 1 mg via INTRAVENOUS

## 2020-03-24 MED ORDER — LACTATED RINGERS IV SOLN
INTRAVENOUS | Status: AC | PRN
  Administered 2020-03-24: 1000 mL

## 2020-03-24 MED ORDER — SODIUM CHLORIDE 0.9 % IV SOLN
INTRAVENOUS | Status: AC
  Filled 2020-03-24: qty 20

## 2020-03-24 MED ORDER — ACETAMINOPHEN 10 MG/ML IV SOLN: 1000.0000 mg | Freq: Once | INTRAVENOUS | Status: DC | PRN

## 2020-03-24 MED ORDER — ROCURONIUM BROMIDE 10 MG/ML (PF) SYRINGE
PREFILLED_SYRINGE | INTRAVENOUS | Status: DC | PRN
  Administered 2020-03-24: 60 mg via INTRAVENOUS
  Administered 2020-03-24: 20 mg via INTRAVENOUS

## 2020-03-24 MED ORDER — POTASSIUM CHLORIDE IN NACL 20-0.45 MEQ/L-% IV SOLN
INTRAVENOUS | Status: DC
  Filled 2020-03-24 (×2): qty 1000

## 2020-03-24 MED ORDER — DEXTROSE 5 % IV SOLN
INTRAVENOUS | Status: DC | PRN
  Administered 2020-03-24: 2 g via INTRAVENOUS

## 2020-03-24 MED ORDER — DEXAMETHASONE SODIUM PHOSPHATE 10 MG/ML IJ SOLN
INTRAMUSCULAR | Status: AC
  Filled 2020-03-24: qty 1

## 2020-03-24 MED ORDER — OXYCODONE HCL 5 MG PO TABS
5.0000 mg | ORAL_TABLET | ORAL | Status: DC | PRN
  Administered 2020-03-24 – 2020-03-25 (×2): 10 mg via ORAL
  Filled 2020-03-24 (×2): qty 2

## 2020-03-24 MED ORDER — ROCURONIUM BROMIDE 10 MG/ML (PF) SYRINGE
PREFILLED_SYRINGE | INTRAVENOUS | Status: AC
  Filled 2020-03-24: qty 10

## 2020-03-24 MED ORDER — LABETALOL HCL 5 MG/ML IV SOLN
INTRAVENOUS | Status: DC | PRN
  Administered 2020-03-24: 2.5 mg via INTRAVENOUS
  Administered 2020-03-24: 5 mg via INTRAVENOUS

## 2020-03-24 MED ORDER — LIDOCAINE 2% (20 MG/ML) 5 ML SYRINGE
INTRAMUSCULAR | Status: AC
  Filled 2020-03-24: qty 5

## 2020-03-24 MED ORDER — ACETAMINOPHEN 160 MG/5ML PO SOLN: 1000.0000 mg | Freq: Once | ORAL | Status: DC | PRN

## 2020-03-24 MED ORDER — TRAMADOL HCL 50 MG PO TABS
50.0000 mg | ORAL_TABLET | Freq: Four times a day (QID) | ORAL | Status: DC | PRN
  Administered 2020-03-24: 50 mg via ORAL
  Filled 2020-03-24: qty 1

## 2020-03-24 MED ORDER — LIDOCAINE 2% (20 MG/ML) 5 ML SYRINGE
INTRAMUSCULAR | Status: DC | PRN
  Administered 2020-03-24: 100 mg via INTRAVENOUS

## 2020-03-24 MED ORDER — HYDROMORPHONE HCL 1 MG/ML IJ SOLN
1.0000 mg | INTRAMUSCULAR | Status: DC | PRN
  Administered 2020-03-24 – 2020-03-25 (×2): 1 mg via INTRAVENOUS
  Filled 2020-03-24 (×2): qty 1

## 2020-03-24 SURGICAL SUPPLY — 34 items
APPLIER CLIP ROT 10 11.4 M/L (STAPLE) ×2
CABLE HIGH FREQUENCY MONO STRZ (ELECTRODE) ×2 IMPLANT
CHLORAPREP W/TINT 26 (MISCELLANEOUS) ×4 IMPLANT
CLIP APPLIE ROT 10 11.4 M/L (STAPLE) ×1 IMPLANT
COVER MAYO STAND STRL (DRAPES) ×2 IMPLANT
COVER SURGICAL LIGHT HANDLE (MISCELLANEOUS) ×2 IMPLANT
COVER WAND RF STERILE (DRAPES) IMPLANT
DECANTER SPIKE VIAL GLASS SM (MISCELLANEOUS) ×2 IMPLANT
DERMABOND ADVANCED (GAUZE/BANDAGES/DRESSINGS)
DERMABOND ADVANCED .7 DNX12 (GAUZE/BANDAGES/DRESSINGS) IMPLANT
DRAPE C-ARM 42X120 X-RAY (DRAPES) ×2 IMPLANT
ELECT REM PT RETURN 15FT ADLT (MISCELLANEOUS) ×2 IMPLANT
GAUZE SPONGE 2X2 8PLY STRL LF (GAUZE/BANDAGES/DRESSINGS) ×1 IMPLANT
GLOVE SURG ORTHO 8.0 STRL STRW (GLOVE) ×2 IMPLANT
GOWN STRL REUS W/TWL XL LVL3 (GOWN DISPOSABLE) ×4 IMPLANT
HEMOSTAT SURGICEL 4X8 (HEMOSTASIS) IMPLANT
KIT BASIN (CUSTOM PROCEDURE TRAY) ×2 IMPLANT
KIT TURNOVER KIT A (KITS) IMPLANT
PENCIL SMOKE EVACUATOR (MISCELLANEOUS) IMPLANT
POUCH SPECIMEN RETRIEVAL 10MM (ENDOMECHANICALS) ×2 IMPLANT
SCISSORS LAP 5X35 DISP (ENDOMECHANICALS) ×2 IMPLANT
SET CHOLANGIOGRAPH MIX (MISCELLANEOUS) ×2 IMPLANT
SET IRRIG TUBING LAPAROSCOPIC (IRRIGATION / IRRIGATOR) ×2 IMPLANT
SET TUBE SMOKE EVAC HIGH FLOW (TUBING) IMPLANT
SLEEVE XCEL OPT CAN 5 100 (ENDOMECHANICALS) ×2 IMPLANT
SPONGE GAUZE 2X2 STER 10/PKG (GAUZE/BANDAGES/DRESSINGS) ×1
STRIP CLOSURE SKIN 1/2X4 (GAUZE/BANDAGES/DRESSINGS) IMPLANT
SUT MNCRL AB 4-0 PS2 18 (SUTURE) ×2 IMPLANT
TOWEL OR 17X26 10 PK STRL BLUE (TOWEL DISPOSABLE) ×2 IMPLANT
TOWEL OR NON WOVEN STRL DISP B (DISPOSABLE) ×2 IMPLANT
TRAY LAPAROSCOPIC (CUSTOM PROCEDURE TRAY) ×2 IMPLANT
TROCAR BLADELESS OPT 5 100 (ENDOMECHANICALS) ×2 IMPLANT
TROCAR XCEL BLUNT TIP 100MML (ENDOMECHANICALS) ×2 IMPLANT
TROCAR XCEL NON-BLD 11X100MML (ENDOMECHANICALS) ×2 IMPLANT

## 2020-03-24 NOTE — Anesthesia Procedure Notes (Signed)
Procedure Name: Intubation Date/Time: 03/24/2020 10:42 AM Performed by: Lissa Morales, CRNA Pre-anesthesia Checklist: Patient identified, Emergency Drugs available, Suction available and Patient being monitored Patient Re-evaluated:Patient Re-evaluated prior to induction Oxygen Delivery Method: Circle system utilized Preoxygenation: Pre-oxygenation with 100% oxygen Induction Type: IV induction Ventilation: Mask ventilation without difficulty Laryngoscope Size: Mac and 4 Grade View: Grade II Tube type: Oral Tube size: 7.0 mm Number of attempts: 2 Airway Equipment and Method: Stylet and Oral airway Placement Confirmation: ETT inserted through vocal cords under direct vision,  positive ETCO2 and breath sounds checked- equal and bilateral Secured at: 22 cm Tube secured with: Tape Dental Injury: Teeth and Oropharynx as per pre-operative assessment  Comments: First attempt, cord not open and pt stated breathing after 79m of rocuroniun.more muscle relaxant given.Larnyx sligthly anterior, overbite

## 2020-03-24 NOTE — Op Note (Signed)
Procedure Note  Pre-operative Diagnosis:  Acute cholecystitis, cholelithiasis  Post-operative Diagnosis:  same  Surgeon:  Armandina Gemma, MD  Assistant:  none   Procedure:  Laparoscopic cholecystectomy with intra-operative cholangiography  Anesthesia:  General  Estimated Blood Loss:  minimal  Drains: none         Specimen: gallbladder to pathology  Indications:  This is a 34 yo white female with a history of depression who began having right-sided back pain that radiated around to her RUQ and ultimately her chest on Wednesday.  She initially had N/V as well as gas and bloating.  She was concerned since her pain was in her chest and went to the ED at [facility].  The wait time was very long and her pain subsided and so she went home without being seen.  Unfortunately the following day, her pain returned and continued to worsen.  She had a a virtual visit with her PCP who set her up for an outpatient [place] but she couldn't make it as her pain got significantly worse.  She denies any fevers or chills.  She tried something for constipation but that didn't work either.  She presented to [facility] ED today and was found to have a WBC of 13K and an [place] c/w cholecystitis with gallbladder wall thickening and pericholecystic fluid.  She was tx to [facility] for our evaluation and admission.  Procedure Details:  The patient was seen in the pre-op holding area. The risks, benefits, complications, treatment options, and expected outcomes were previously discussed with the patient. The patient agreed with the proposed plan and has signed the informed consent form.  The patient was transported to operating room # 1 at the [facility]. The patient was placed in the supine position on the operating room table. Following induction of general anesthesia, the abdomen was prepped and draped in the usual aseptic fashion.  An incision was made in the skin near the umbilicus. The midline fascia was incised and the peritoneal  cavity was entered and a Hasson cannula was introduced under direct vision. The cannula was secured with a 0-Vicryl pursestring suture. Pneumoperitoneum was established with carbon dioxide. Additional cannulae were introduced under direct vision along the right costal margin in the midline, mid-clavicular line, and anterior axillary line.   The gallbladder was identified and the fundus grasped and retracted cephalad. The gallbladder was markedly inflamed, thick walled, and edematous. Adhesions were taken down bluntly and the electrocautery was utilized as needed, taking care not to involve any adjacent structures. The infundibulum was grasped and retracted laterally, exposing the peritoneum overlying the triangle of Calot. The peritoneum was incised and structures exposed with blunt dissection. The cystic duct was clearly identified, bluntly dissected circumferentially, and clipped at the neck of the gallbladder.  An incision was made in the cystic duct and the cholangiogram catheter introduced. The catheter was secured using an ligaclip.  Real-time cholangiography was performed using C-arm fluoroscopy.  There was rapid filling of a normal caliber common bile duct.  There was reflux of contrast into the left and right hepatic ductal systems.  There was free flow distally into the duodenum without filling defect or obstruction.  The catheter was removed from the peritoneal cavity.  The cystic duct was then ligated with ligaclips and divided. The cystic artery was identified, dissected circumferentially, ligated with ligaclips, and divided.  The gallbladder was dissected away from the gallbladder bed using the electrocautery for hemostasis. The gallbladder was completely removed from the liver and  placed into an endocatch bag. The gallbladder was removed in the endocatch bag through the umbilical port site and submitted to pathology for review.  The right upper quadrant was irrigated and the gallbladder bed  was inspected. Hemostasis was achieved with the electrocautery.  Cannulae were removed under direct vision and good hemostasis was noted. Pneumoperitoneum was released and the majority of the carbon dioxide evacuated. The umbilical wound was irrigated and the fascia was then closed with the pursestring suture.  Local anesthetic was infiltrated at all port sites. Skin incisions were closed with 4-0 Monocril subcuticular sutures and Dermabond was applied.  Instrument, sponge, and needle counts were correct at the conclusion of the case.  The patient was awakened from anesthesia and brought to the recovery room in stable condition.  The patient tolerated the procedure well.   Darnell Level, MD [facility] Office: [phone]

## 2020-03-24 NOTE — Progress Notes (Signed)
1g IV tylenol given upon arrival to pacu per CRNA Marylu Lund.

## 2020-03-24 NOTE — Anesthesia Preprocedure Evaluation (Signed)
Anesthesia Evaluation  Patient identified by MRN, date of birth, ID band Patient awake    Reviewed: Allergy & Precautions, NPO status , Patient's Chart, lab work & pertinent test results  History of Anesthesia Complications Negative for: history of anesthetic complications  Airway Mallampati: II  TM Distance: >3 FB Neck ROM: Full    Dental  (+) Dental Advisory Given, Teeth Intact   Pulmonary neg pulmonary ROS, neg shortness of breath, neg sleep apnea, neg COPD, neg recent URI,    breath sounds clear to auscultation       Cardiovascular (-) hypertension(-) angina(-) Past MI negative cardio ROS   Rhythm:Regular     Neuro/Psych PSYCHIATRIC DISORDERS Depression negative neurological ROS     GI/Hepatic Neg liver ROS, CHOLECYSTITIS   Endo/Other  negative endocrine ROS  Renal/GU negative Renal ROS     Musculoskeletal negative musculoskeletal ROS (+)   Abdominal   Peds  Hematology negative hematology ROS (+)   Anesthesia Other Findings   Reproductive/Obstetrics                             Anesthesia Physical Anesthesia Plan  ASA: II  Anesthesia Plan: General   Post-op Pain Management:    Induction: Intravenous  PONV Risk Score and Plan: 3 and Ondansetron and Dexamethasone  Airway Management Planned: Oral ETT  Additional Equipment: None  Intra-op Plan:   Post-operative Plan: Extubation in OR  Informed Consent: I have reviewed the patients History and Physical, chart, labs and discussed the procedure including the risks, benefits and alternatives for the proposed anesthesia with the patient or authorized representative who has indicated his/her understanding and acceptance.     Dental advisory given  Plan Discussed with: CRNA and Surgeon  Anesthesia Plan Comments:         Anesthesia Quick Evaluation

## 2020-03-24 NOTE — Transfer of Care (Signed)
Immediate Anesthesia Transfer of Care Note  Patient: Yesenia Johnson  Procedure(s) Performed: LAPAROSCOPIC CHOLECYSTECTOMY WITH INTRAOPERATIVE CHOLANGIOGRAM (N/A Abdomen)  Patient Location: PACU  Anesthesia Type:General  Level of Consciousness: awake, alert , oriented and patient cooperative  Airway & Oxygen Therapy: Patient Spontanous Breathing and Patient connected to face mask oxygen  Post-op Assessment: Report given to RN, Post -op Vital signs reviewed and stable and Patient moving all extremities X 4  Post vital signs: stable  Last Vitals:  Vitals Value Taken Time  BP 122/77 03/24/20 1200  Temp 37.2 C 03/24/20 1153  Pulse 70 03/24/20 1201  Resp 13 03/24/20 1201  SpO2 100 % 03/24/20 1201  Vitals shown include unvalidated device data.  Last Pain:  Vitals:   03/24/20 1153  TempSrc:   PainSc: 0-No pain      Patients Stated Pain Goal: 3 (03/24/20 0603)  Complications: No apparent anesthesia complications

## 2020-03-24 NOTE — Anesthesia Postprocedure Evaluation (Signed)
Anesthesia Post Note  Patient: Yesenia Johnson  Procedure(s) Performed: LAPAROSCOPIC CHOLECYSTECTOMY WITH INTRAOPERATIVE CHOLANGIOGRAM (N/A Abdomen)     Patient location during evaluation: PACU Anesthesia Type: General Level of consciousness: awake and alert Pain management: pain level controlled Vital Signs Assessment: post-procedure vital signs reviewed and stable Respiratory status: spontaneous breathing, nonlabored ventilation, respiratory function stable and patient connected to nasal cannula oxygen Cardiovascular status: blood pressure returned to baseline and stable Postop Assessment: no apparent nausea or vomiting Anesthetic complications: no    Last Vitals:  Vitals:   03/24/20 1215 03/24/20 1230  BP: 132/79 115/83  Pulse: 77 72  Resp: 20 18  Temp:    SpO2: 100% 100%    Last Pain:  Vitals:   03/24/20 1230  TempSrc:   PainSc: 4                  Kalyan Barabas

## 2020-03-25 MED ORDER — OXYCODONE HCL 5 MG PO TABS: 5.0000 mg | ORAL_TABLET | ORAL | 0 refills | Status: DC | PRN

## 2020-03-25 MED ORDER — CELECOXIB 200 MG PO CAPS: 400.0000 mg | ORAL_CAPSULE | ORAL | Status: DC

## 2020-03-25 MED ORDER — GABAPENTIN 300 MG PO CAPS: 300.0000 mg | ORAL_CAPSULE | ORAL | Status: DC

## 2020-03-25 NOTE — Discharge Summary (Signed)
Physician Discharge Summary [facility]  Patient ID: Yesenia Johnson MRN: [id number] DOB/AGE: Jun 27, 1986 34 y.o.  Admit date: 03/23/2020 Discharge date: 03/25/2020  Admission Diagnoses:  Acute cholecystitis, cholelithiasis  Discharge Diagnoses:  Active Problems:   Acute cholecystitis   Discharged Condition: good  Hospital Course: Patient was admitted for observation following gallbladder surgery.  Post op course was uncomplicated.  Pain was well controlled.  Tolerated diet.  Patient was prepared for discharge home on POD#1.  Consults: None  Treatments: surgery: lap chole with IOC; IV abx's  Discharge Exam: Blood pressure 130/82, pulse 86, temperature 98.8 F (37.1 C), temperature source Oral, resp. rate 18, height 5\' 6"  (1.676 m), weight 73.9 kg, SpO2 98 %. HEENT - clear Neck - soft Chest - clear bilaterally Cor - RRR Abd - soft without distension; wounds dry and intact with Dermabond in place  Disposition: Home  Discharge Instructions    Diet - low sodium heart healthy   Complete by: As directed    Discharge instructions   Complete by: As directed    [facility]  LAPAROSCOPIC SURGERY:  POST-OP INSTRUCTIONS  Always review your discharge instruction sheet given to you by the facility where your surgery was performed.  A prescription for pain medication may be given to you upon discharge.  Take your pain medication as prescribed.  If narcotic pain medicine is not needed, then you may take acetaminophen (Tylenol) or ibuprofen (Advil) as needed.  Take your usually prescribed medications unless otherwise directed.  If you need a refill on your pain medication, please contact your pharmacy.  They will contact our office to request authorization. Prescriptions will not be filled after 5 P.M. or on weekends.  You should follow a light diet the first few days after arrival home, such as soup and crackers or toast.  Be sure to include  plenty of fluids daily.  Most patients will experience some swelling and bruising in the area of the incisions.  Ice packs will help.  Swelling and bruising can take several days to resolve.   It is common to experience some constipation after surgery.  Increasing fluid intake and taking a stool softener (such as Colace) will usually help or prevent this problem from occurring.  A mild laxative (Milk of Magnesia or Miralax) should be taken according to package instructions if there has been no bowel movement after 48 hours.  You will likely have Dermabond (topical glue) over your incisions.  This seals the incisions and allows you to bathe and shower at any time after your surgery.  Glue should remain in place for up to 10 days.  It may be removed after 10 days by pealing off the Dermabond material or using Vaseline or naval jelly to remove.  If you have steri-strips over your incisions, you may remove the gauze bandage on the second day after surgery, and you may shower at that time.  Leave your steri-strips (small skin tapes) in place directly over the incision.  These strips should remain on the skin for 5-7 days and then be removed.  You may get them wet in the shower and pat them dry.  Any sutures or staples will be removed at the office during your follow-up visit.  ACTIVITIES:  You may resume regular (light) daily activities beginning the next day - such as daily self-care, walking, climbing stairs - gradually increasing activities as tolerated.  You may have sexual intercourse when it is comfortable.  Refrain from any heavy lifting or straining until approved by your doctor.  You may drive when you are no longer taking prescription pain medication, when you can comfortably wear a seatbelt, and when you can safely maneuver your car and apply brakes.  You should see your doctor in the office for a follow-up appointment approximately 2-3 weeks after your surgery.  Make sure that you call for  this appointment within a day or two after you arrive home to insure a convenient appointment time.  WHEN TO CALL YOUR DOCTOR: Fever over 101.0 Inability to urinate Continued bleeding from incision Increased pain, redness, or drainage from the incision Increasing abdominal pain  The clinic staff is available to answer your questions during regular business hours.  Please don't hesitate to call and ask to speak to one of the nurses for clinical concerns.  If you have a medical emergency, go to the nearest emergency room or call 911.  A surgeon from [facility] is always on call for the hospital.  Velora Heckler, MD, [facility] Office: [phone] Toll Free:  [phone] FAX [phone]  Website: [email or web address]   Increase activity slowly   Complete by: As directed    No dressing needed   Complete by: As directed       Follow-up Information    [facility] Schedule an appointment as soon as possible for a visit in 2 week(s).   Specialty: General Surgery Contact information: [address] [place] [address] [phone]          Velora Heckler, MD, [facility] Office: [phone]   Signed: Darnell Level 03/25/2020, 8:21 AM

## 2020-03-25 NOTE — Progress Notes (Signed)
Discharge instructions given to pt and all questions were answered.  

## 2020-03-27 LAB — SURGICAL PATHOLOGY

## 2020-04-04 ENCOUNTER — Encounter: Payer: Self-pay | Admitting: Family Medicine

## 2020-04-04 ENCOUNTER — Other Ambulatory Visit: Payer: Self-pay

## 2020-04-04 ENCOUNTER — Ambulatory Visit: Payer: 59 | Admitting: Family Medicine

## 2020-04-04 VITALS — BP 120/70 | HR 75 | Temp 97.5°F | Wt 158.2 lb

## 2020-04-04 DIAGNOSIS — R899 Unspecified abnormal finding in specimens from other organs, systems and tissues: Secondary | ICD-10-CM

## 2020-04-04 DIAGNOSIS — L237 Allergic contact dermatitis due to plants, except food: Secondary | ICD-10-CM

## 2020-04-04 DIAGNOSIS — Z9889 Other specified postprocedural states: Secondary | ICD-10-CM

## 2020-04-04 MED ORDER — SUGAMMADEX SODIUM 200 MG/2ML IV SOLN
INTRAVENOUS | Status: DC | PRN
  Administered 2020-03-24: 200 mg via INTRAVENOUS

## 2020-04-04 MED ORDER — TRIAMCINOLONE ACETONIDE 0.1 % EX CREA: 1.0000 "application " | TOPICAL_CREAM | Freq: Two times a day (BID) | CUTANEOUS | 0 refills | Status: DC

## 2020-04-04 NOTE — Progress Notes (Signed)
   Subjective:    Patient ID: Yesenia Johnson, female    DOB: 1986/07/23, 34 y.o.   MRN: [id number]  HPI Chief Complaint  Patient presents with  . rash on neck and eyelid    rash on neck and eyelid and nose. started several days ago.    Complains of 4-5 day history pruritic linear rash on her neck and left eyelid.  States she realized yesterday that she had been exposed to poison ivy. Has been using calamine lotion.   She had her gallbladder removed 11 days ago. Her liver enzymes were elevated. Also, her calcium and albumin were low.  States she is feeling physically well.  She does have some fatigue in the afternoons. She does not want labs today.   Denies fever, chills, abdominal pain, nausea, vomiting, diarrhea.   Review of Systems Pertinent positives and negatives in the history of present illness.     Objective:   Physical Exam BP 120/70   Pulse 75   Temp (!) 97.5 F (36.4 C)   Wt 158 lb 3.2 oz (71.8 kg)   BMI 25.53 kg/m   Alert oriented no acute distress. Linear rash to left lateral neck, right anterior neck and discreet red pinpoint eruptions near her left outer canthus. Normal left eye exam otherwise No surrounding erythema, induration or drainage.       Assessment & Plan:  Allergic dermatitis due to poison ivy - Plan: triamcinolone cream (KENALOG) 0.1 %  Abnormal laboratory test result  Status post surgery  She may use topical steroid on neck but not her eye. Follow up if worsening and she will need oral steroids at that time. Use cool compresses for itching, Benadryl as needed.  Declines labs but will schedule a fasting CPE soon.

## 2020-04-25 NOTE — Patient Instructions (Signed)
Preventive Care 21-34 Years Old, Female Preventive care refers to visits with your health care provider and lifestyle choices that can promote health and wellness. This includes:  A yearly physical exam. This may also be called an annual well check.  Regular dental visits and eye exams.  Immunizations.  Screening for certain conditions.  Healthy lifestyle choices, such as eating a healthy diet, getting regular exercise, not using drugs or products that contain nicotine and tobacco, and limiting alcohol use. What can I expect for my preventive care visit? Physical exam Your health care provider will check your:  Height and weight. This may be used to calculate body mass index (BMI), which tells if you are at a healthy weight.  Heart rate and blood pressure.  Skin for abnormal spots. Counseling Your health care provider may ask you questions about your:  Alcohol, tobacco, and drug use.  Emotional well-being.  Home and relationship well-being.  Sexual activity.  Eating habits.  Work and work environment.  Method of birth control.  Menstrual cycle.  Pregnancy history. What immunizations do I need?  Influenza (flu) vaccine  This is recommended every year. Tetanus, diphtheria, and pertussis (Tdap) vaccine  You may need a Td booster every 10 years. Varicella (chickenpox) vaccine  You may need this if you have not been vaccinated. Human papillomavirus (HPV) vaccine  If recommended by your health care provider, you may need three doses over 6 months. Measles, mumps, and rubella (MMR) vaccine  You may need at least one dose of MMR. You may also need a second dose. Meningococcal conjugate (MenACWY) vaccine  One dose is recommended if you are age 19-21 years and a first-year college student living in a residence hall, or if you have one of several medical conditions. You may also need additional booster doses. Pneumococcal conjugate (PCV13) vaccine  You may need  this if you have certain conditions and were not previously vaccinated. Pneumococcal polysaccharide (PPSV23) vaccine  You may need one or two doses if you smoke cigarettes or if you have certain conditions. Hepatitis A vaccine  You may need this if you have certain conditions or if you travel or work in places where you may be exposed to hepatitis A. Hepatitis B vaccine  You may need this if you have certain conditions or if you travel or work in places where you may be exposed to hepatitis B. Haemophilus influenzae type b (Hib) vaccine  You may need this if you have certain conditions. You may receive vaccines as individual doses or as more than one vaccine together in one shot (combination vaccines). Talk with your health care provider about the risks and benefits of combination vaccines. What tests do I need?  Blood tests  Lipid and cholesterol levels. These may be checked every 5 years starting at age 20.  Hepatitis C test.  Hepatitis B test. Screening  Diabetes screening. This is done by checking your blood sugar (glucose) after you have not eaten for a while (fasting).  Sexually transmitted disease (STD) testing.  BRCA-related cancer screening. This may be done if you have a family history of breast, ovarian, tubal, or peritoneal cancers.  Pelvic exam and Pap test. This may be done every 3 years starting at age 21. Starting at age 30, this may be done every 5 years if you have a Pap test in combination with an HPV test. Talk with your health care provider about your test results, treatment options, and if necessary, the need for more tests.   Follow these instructions at home: Eating and drinking   Eat a diet that includes fresh fruits and vegetables, whole grains, lean protein, and low-fat dairy.  Take vitamin and mineral supplements as recommended by your health care provider.  Do not drink alcohol if: ? Your health care provider tells you not to drink. ? You are  pregnant, may be pregnant, or are planning to become pregnant.  If you drink alcohol: ? Limit how much you have to 0-1 drink a day. ? Be aware of how much alcohol is in your drink. In the [place] one drink equals one 12 oz bottle of beer (355 mL), one 5 oz glass of wine (148 mL), or one 1 oz glass of hard liquor (44 mL). Lifestyle  Take daily care of your teeth and gums.  Stay active. Exercise for at least 30 minutes on 5 or more days each week.  Do not use any products that contain nicotine or tobacco, such as cigarettes, e-cigarettes, and chewing tobacco. If you need help quitting, ask your health care provider.  If you are sexually active, practice safe sex. Use a condom or other form of birth control (contraception) in order to prevent pregnancy and STIs (sexually transmitted infections). If you plan to become pregnant, see your health care provider for a preconception visit. What's next?  Visit your health care provider once a year for a well check visit.  Ask your health care provider how often you should have your eyes and teeth checked.  Stay up to date on all vaccines. This information is not intended to replace advice given to you by your health care provider. Make sure you discuss any questions you have with your health care provider. Document Revised: 07/22/2018 Document Reviewed: 07/22/2018 Elsevier Patient Education  2020 [employer].

## 2020-04-25 NOTE — Progress Notes (Signed)
Subjective:    Patient ID: Yesenia Johnson, female    DOB: [birth year], 34 y.o.   MRN: [id number]  HPI Chief Complaint  Patient presents with  . cpe    cpe, bruising easily the last week or so. pap done in 2019 repeat 5 years   She is here for a complete physical exam.  Other providers: None   States she would like to see GI.  Complains of a history of stomach issues back to childhood. States in college she had issues with certain foods, dairy. States she saw a PCP in 2011 for abdominal pain and had a negative [place], negative barium study. She did not see GI as they recommended.   States she has epigastric pain, sharp cramping pain throughout her abdomen. She also has bloating. No nausea or vomiting. No unexplained weight loss. No change in bowel habits. Bowels move every 2 days. Occasionally she has BRB on the toilet tissue if she is straining.  Greasy foods are definitely a trigger. She has been limiting dairy but still has GI upset  Recent gallbladder removal. States symptoms that she has had her whole life has not changed.   States she had bruising on her left leg for no reason last week but it resolved.   Depression- states her mood has improved since being on Effexor. Is not currently interested in therapy but has been involved counseling for many years. States she had a bad experience with therapy and it took her sometime to get over that.   Hx of colposcopy and states her last pap smear was in 2019 and negative for abnormal cells or HPV.   Hx of hypocalcemia and elevated LFTs which need follow up.   Social history: Lives with her husband, works at [employer] [place]  Drinks alcohol 4-5 drinks per week, beer. Denies  smoking, drinking alcohol, drug use  Diet: skips breakfast, lunch is usually sandwiches.  Excerise: runs with her dog but not lately.   Immunizations: UTD   Health maintenance:  Mammogram: N/A Colonoscopy: N/A Last Gynecological Exam: 2019 Last  Menstrual cycle: 03/30/2020 Pregnancies: 0 Last Dental Exam: years ago  Last Eye Exam: 2 months ago   Wears seatbelt always, smoke detectors in home and functioning, does not text while driving and feels safe in home environment.   Reviewed allergies, medications, past medical, surgical, family, and social history.   Review of Systems Review of Systems Constitutional: -fever, -chills, -sweats, -unexpected weight change,-fatigue ENT: -runny nose, -ear pain, -sore throat Cardiology:  -chest pain, -palpitations, -edema Respiratory: -cough, -shortness of breath, -wheezing Gastroenterology: +intermittent abdominal pain, -nausea, -vomiting, -diarrhea, -constipation  Hematology: -bleeding, + unexplained bruising  Musculoskeletal: -arthralgias, -myalgias, -joint swelling, -back pain Ophthalmology: -vision changes Urology: -dysuria, -difficulty urinating, -hematuria, -urinary frequency, -urgency Neurology: -headache, -weakness, -tingling, -numbness       Objective:   Physical Exam BP 110/70   Pulse 94   Ht 5' 7.25" (1.708 m)   Wt 161 lb 6.4 oz (73.2 kg)   BMI 25.09 kg/m   General Appearance:    Alert, cooperative, no distress, appears stated age  Head:    Normocephalic, without obvious abnormality, atraumatic  Eyes:    PERRL, conjunctiva/corneas clear, EOM's intact  Ears:    Normal TM's and external ear canals  Nose:   Mask in place   Throat:   Mask in place   Neck:   Supple, no lymphadenopathy;  thyroid:  no   enlargement/tenderness/nodules; no JVD  Back:  Spine nontender, no curvature, ROM normal, no CVA     tenderness  Lungs:     Clear to auscultation bilaterally without wheezes, rales or     ronchi; respirations unlabored  Chest Wall:    No tenderness or deformity   Heart:    Regular rate and rhythm, S1 and S2 normal, no murmur, rub   or gallop  Breast Exam:    No tenderness, masses, or nipple discharge or inversion.      No axillary lymphadenopathy  Abdomen:     Soft,  non-tender, nondistended, normoactive bowel sounds,    no masses, no hepatosplenomegaly  Genitalia:    Normal external genitalia without lesions.  BUS and vagina normal; cervical os not visualized, exam stopped due to patient's discomfort.  No abnormal vaginal discharge.   Pap not performed. Chaperone present.   Rectal:    Not performed due to age<40 and no related complaints  Extremities:   No clubbing, cyanosis or edema  Pulses:   2+ and symmetric all extremities  Skin:   Skin color, texture, turgor normal, no rashes or lesions  Lymph nodes:   Cervical, supraclavicular, and axillary nodes normal  Neurologic:   CNII-XII intact, normal strength, sensation and gait; reflexes 2+ and symmetric throughout          Psych:   Normal mood, affect, hygiene and grooming.         Assessment & Plan:  Routine general medical examination at a health care facility - Plan: CBC with Differential/Platelet, Comprehensive metabolic panel, TSH, T4, free, T3, Lipid panel -here for fasting CPE. In her usual state of health. Preventive health care reviewed. Pap smear UTD. Self breast exams are being done. Recommend regular dental care.  Counseling on healthy lifestyle including diet and exercise.  Immunizations reviewed.   Depression, unspecified depression type -she is doing better on Effexor and would like to continue on medication. Not in counseling and not currently interested   Elevated ALT measurement - Plan: Comprehensive metabolic panel -check LFTs and follow up  Family history of diabetes mellitus in mother - Plan: Hemoglobin A1c -aware of increased risk due to family history. Continue to monitor   Bloating - Plan: Ambulatory referral to Gastroenterology  Intermittent abdominal pain - Plan: Ambulatory referral to Gastroenterology -long history or GI issues, referral made per patient request   Hypocalcemia - Plan: Comprehensive metabolic panel, VITAMIN D 25 Hydroxy (Vit-D Deficiency,  Fractures) -discussed getting adequate calcium in diet. Recheck serum calcium and vitamin D level.   Abnormal bruising - Plan: CBC with Differential/Platelet -unclear etiology for recent bruising. No abnormal bruising visible today. Check labs and follow up.

## 2020-04-26 ENCOUNTER — Other Ambulatory Visit: Payer: Self-pay

## 2020-04-26 ENCOUNTER — Encounter: Payer: Self-pay | Admitting: Gastroenterology

## 2020-04-26 ENCOUNTER — Ambulatory Visit: Payer: 59 | Admitting: Family Medicine

## 2020-04-26 ENCOUNTER — Encounter: Payer: Self-pay | Admitting: Family Medicine

## 2020-04-26 VITALS — BP 110/70 | HR 94 | Ht 67.25 in | Wt 161.4 lb

## 2020-04-26 DIAGNOSIS — R109 Unspecified abdominal pain: Secondary | ICD-10-CM

## 2020-04-26 DIAGNOSIS — R7401 Elevation of levels of liver transaminase levels: Secondary | ICD-10-CM

## 2020-04-26 DIAGNOSIS — Z0000 Encounter for general adult medical examination without abnormal findings: Secondary | ICD-10-CM

## 2020-04-26 DIAGNOSIS — Z833 Family history of diabetes mellitus: Secondary | ICD-10-CM | POA: Diagnosis not present

## 2020-04-26 DIAGNOSIS — F329 Major depressive disorder, single episode, unspecified: Secondary | ICD-10-CM | POA: Diagnosis not present

## 2020-04-26 DIAGNOSIS — R233 Spontaneous ecchymoses: Secondary | ICD-10-CM

## 2020-04-26 DIAGNOSIS — R140 Abdominal distension (gaseous): Secondary | ICD-10-CM | POA: Diagnosis not present

## 2020-04-26 DIAGNOSIS — R238 Other skin changes: Secondary | ICD-10-CM

## 2020-04-26 DIAGNOSIS — F32A Depression, unspecified: Secondary | ICD-10-CM

## 2020-04-27 LAB — COMPREHENSIVE METABOLIC PANEL
ALT: 27 IU/L (ref 0–32)
AST: 24 IU/L (ref 0–40)
Albumin/Globulin Ratio: 1.3 (ref 1.2–2.2)
Albumin: 4.1 g/dL (ref 3.8–4.8)
Alkaline Phosphatase: 91 IU/L (ref 48–121)
BUN/Creatinine Ratio: 9 (ref 9–23)
BUN: 8 mg/dL (ref 6–20)
Bilirubin Total: 0.9 mg/dL (ref 0.0–1.2)
CO2: 21 mmol/L (ref 20–29)
Calcium: 9.4 mg/dL (ref 8.7–10.2)
Chloride: 103 mmol/L (ref 96–106)
Creatinine, Ser: 0.85 mg/dL (ref 0.57–1.00)
GFR calc Af Amer: 103 mL/min/{1.73_m2} (ref >59)
GFR calc non Af Amer: 90 mL/min/{1.73_m2} (ref >59)
Globulin, Total: 3.1 g/dL (ref 1.5–4.5)
Glucose: 89 mg/dL (ref 65–99)
Potassium: 4.5 mmol/L (ref 3.5–5.2)
Sodium: 135 mmol/L (ref 134–144)
Total Protein: 7.2 g/dL (ref 6.0–8.5)

## 2020-04-27 LAB — CBC WITH DIFFERENTIAL/PLATELET
Basophils Absolute: 0.1 10*3/uL (ref 0.0–0.2)
Basos: 2 %
EOS (ABSOLUTE): 0.3 10*3/uL (ref 0.0–0.4)
Eos: 4 %
Hematocrit: 42.4 % (ref 34.0–46.6)
Hemoglobin: 14 g/dL (ref 11.1–15.9)
Immature Grans (Abs): 0 10*3/uL (ref 0.0–0.1)
Immature Granulocytes: 0 %
Lymphocytes Absolute: 2.9 10*3/uL (ref 0.7–3.1)
Lymphs: 42 %
MCH: 29.8 pg (ref 26.6–33.0)
MCHC: 33 g/dL (ref 31.5–35.7)
MCV: 90 fL (ref 79–97)
Monocytes Absolute: 0.5 10*3/uL (ref 0.1–0.9)
Monocytes: 8 %
Neutrophils Absolute: 3 10*3/uL (ref 1.4–7.0)
Neutrophils: 44 %
Platelets: 319 10*3/uL (ref 150–450)
RBC: 4.7 x10E6/uL (ref 3.77–5.28)
RDW: 11.8 % (ref 11.7–15.4)
WBC: 6.8 10*3/uL (ref 3.4–10.8)

## 2020-04-27 LAB — T3: T3, Total: 148 ng/dL (ref 71–180)

## 2020-04-27 LAB — LIPID PANEL
Chol/HDL Ratio: 3.1 ratio (ref 0.0–4.4)
Cholesterol, Total: 195 mg/dL (ref 100–199)
HDL: 63 mg/dL (ref >39)
LDL Chol Calc (NIH): 118 mg/dL — ABNORMAL HIGH (ref 0–99)
Triglycerides: 76 mg/dL (ref 0–149)
VLDL Cholesterol Cal: 14 mg/dL (ref 5–40)

## 2020-04-27 LAB — HEMOGLOBIN A1C
Est. average glucose Bld gHb Est-mCnc: 100 mg/dL
Hgb A1c MFr Bld: 5.1 % (ref 4.8–5.6)

## 2020-04-27 LAB — VITAMIN D 25 HYDROXY (VIT D DEFICIENCY, FRACTURES): Vit D, 25-Hydroxy: 24.9 ng/mL — ABNORMAL LOW (ref 30.0–100.0)

## 2020-04-27 LAB — TSH: TSH: 3.49 u[IU]/mL (ref 0.450–4.500)

## 2020-04-27 LAB — T4, FREE: Free T4: 1.28 ng/dL (ref 0.82–1.77)

## 2020-06-15 ENCOUNTER — Ambulatory Visit: Payer: 59 | Admitting: Gastroenterology

## 2020-06-15 ENCOUNTER — Encounter: Payer: Self-pay | Admitting: Gastroenterology

## 2020-06-15 VITALS — BP 128/70 | HR 84 | Ht 66.0 in | Wt 158.8 lb

## 2020-06-15 DIAGNOSIS — R140 Abdominal distension (gaseous): Secondary | ICD-10-CM

## 2020-06-15 DIAGNOSIS — R1013 Epigastric pain: Secondary | ICD-10-CM

## 2020-06-15 MED ORDER — DICYCLOMINE HCL 20 MG PO TABS: 20.0000 mg | ORAL_TABLET | Freq: Four times a day (QID) | ORAL | 1 refills | Status: DC | PRN

## 2020-06-15 NOTE — Patient Instructions (Addendum)
We discussed two options to start your evaluation: (1) trying a low FODMAP diet and blood testing for celiac and H pylori or (2) upper endoscopy with esophageal, gastric, or duodenal biopsies with directed therapy based on those results.   FODMAP stands for "fermentable oligodi-monosaccharides and polyols," or, more simply, certain types of carbohydrates found in foods that are hard to digest. By following FODMAP, also known as a low-FODMAP diet, you avoid or limit these particular carbohydrates. Some of the foods that contain FODMAPs include: - Fruits such as apples, apricots, blackberries, cherries, mango, nectarines, pears, plums, and watermelon, or juice containing any of these fruits - Canned fruit in natural fruit juice, or large quantities of fruit juice or dried fruit - Vegentables such as artichokes, asparagus, beans, cabbage, cauliflower, garlic and garlic salts, lentils, mushrooms, onions, and sugar snap or snow peas - Dairy products such as milk, milk products, soft cheese, yogurt, custard, and ice cream - Wheat and rye products - Honey and foods with high-fructose corn syrup  - Products, including candy and gum, with sweeteners ending in "-ol" (for example, sorbitol, mannitol, xylitol, and maltitol)  My favorite website for more information is: FindScifi.com.ee  Dicyclomine can be used as needed when the abdominal pain starts to try to minimize your symptoms.   We have sent the following medications to your pharmacy for you to pick up at your convenience: Dicyclomine   You have been scheduled for an endoscopy. Please follow written instructions given to you at your visit today. If you use inhalers (even only as needed), please bring them with you on the day of your procedure.   Thank you for choosing me and [facility].  Dr.Danna Sewell

## 2020-06-15 NOTE — Progress Notes (Signed)
Referring Provider: Avanell Shackleton, NP-C Primary Care Physician:  Avanell Shackleton, NP-C  Reason for Consultation:  Bloating and abdominal pain   IMPRESSION:  Intermittent epigastric pain x years with associated bloating Recent cholecystectomy for symptomatic gallstones without change in pain or bloating  Differential: H pylori, celiac, esophagitis, gastritis, functional dyspepsia, recurrent gallstones, gastroparesis, food intolerance, less likely gastric outlet obstruction  Discuss testing and treating versus endoscopy for definitive diagnosis. She would like to consider the options. Trial of dicyclomine PRN in the meantime.   PLAN: Dicylcomine 20 mg QID PRN abdominal pain EGD with esophageal, gastric, and duodenal biopsies TTGA and IGA and H pylori testing if not proceeding with EGD  Please see the "Patient Instructions" section for addition details about the plan.  HPI: CABELA PACIFICO is a 34 y.o. female referred by NP [facility] for further evaluation of bloating and abdominal pain. The history is obtained through the patient and review of her electronic health record. 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 Follows a vegan diet.  She has hypercholesterolemia, UTI, depression.   Reports a history of stomach issues back to childhood - as early as 2nd or 3rd grade. Remembers having an ultrasound. Occurs every few weeks to months. States in college she had issues with certain foods especially ChikFilA and dairy. Avoiding triggers minimizes the symptoms but does not completely prevent/relieve them.  States she saw a PCP in 2011 for abdominal pain and had a negative [place], negative barium study. She did not see GI as recommended.   2013 she had a normal barium UGI series in [place].   States she has intermittent epigastric pain, sharp cramping pain throughout her abdomen that radiates to her back with associated bloating. Pain feels similar to symptomatic  gallstones. No nausea or vomiting. No unexplained weight loss. No change in bowel habits. Bowel movement every 2 days. Occasionally she sees BRB on the toilet tissue with straining.   Greasy foods are definitely a trigger. She has been limiting dairy but still has GI upset Will lie in the fetal position and sleep, ultimately providing relief.  Temporary improvement with defecation. No association with eating or position.   Recent gallbladder removal for symptomatic cholecystectomy without any change in symptoms.  Trial of GasEx provided no relief.  Tried Prilosec in college and didn't find that helpful  Abnormal liver enzymes 03/24/20 Abdominal ultrasound 03/23/20: acute cholecystitis, cholelithiasis IOC 03/24/20: normal   Multiple family members with gallbladder disease. No known family history of colon cancer or polyps. No family history of uterine/endometrial cancer, pancreatic cancer or gastric/stomach cancer.   Past Medical History:  Diagnosis Date  . Depression     Past Surgical History:  Procedure Laterality Date  . CHOLECYSTECTOMY N/A 03/24/2020   Procedure: LAPAROSCOPIC CHOLECYSTECTOMY WITH INTRAOPERATIVE CHOLANGIOGRAM;  Surgeon: Darnell Level, MD;  Location: [facility];  Service: General;  Laterality: N/A;  . COLPOSCOPY    . WISDOM TOOTH EXTRACTION  11/2016    Current Outpatient Medications  Medication Sig Dispense Refill  . etonogestrel-ethinyl estradiol (NUVARING) 0.12-0.015 MG/24HR vaginal ring INSERT 1 RING VAGINALLY AS DIRECTED. REMOVE AFTER 3 WEEKS & WAIT 7 DAYS BEFORE INSERTING A NEW RING 3 each 1  . venlafaxine XR (EFFEXOR-XR) 75 MG 24 hr capsule TAKE 1 CAPSULE BY MOUTH EVERY DAY WITH BREAKFAST 90 capsule 1   No current facility-administered medications for this visit.    Allergies as of 06/15/2020 - Review Complete 04/26/2020  Allergen Reaction Noted  .  Penicillins Hives 06/16/2017    Family History  Problem Relation Age of Onset  . Hypertension Mother   .  Diabetes Mother   . Stroke Maternal Grandmother   . Diabetes Paternal Grandmother   . Breast cancer Maternal Aunt   . Alcoholism Maternal Aunt   . Diabetes Paternal Aunt     Social History   Socioeconomic History  . Marital status: Married    Spouse name: Not on file  . Number of children: Not on file  . Years of education: Not on file  . Highest education level: Not on file  Occupational History  . Not on file  Tobacco Use  . Smoking status: Never Smoker  . Smokeless tobacco: Never Used  Vaping Use  . Vaping Use: Never used  Substance and Sexual Activity  . Alcohol use: Yes    Alcohol/week: 2.0 standard drinks    Types: 2 Cans of beer per week    Comment: 3-4 drinks per week   . Drug use: No  . Sexual activity: Yes    Partners: Male    Birth control/protection: None  Other Topics Concern  . Not on file  Social History Narrative  . Not on file   Social Determinants of Health   Financial Resource Strain:   . Difficulty of Paying Living Expenses:   Food Insecurity:   . Worried About Programme researcher, broadcasting/film/video in the Last Year:   . Barista in the Last Year:   Transportation Needs:   . Freight forwarder (Medical):   Marland Kitchen Lack of Transportation (Non-Medical):   Physical Activity:   . Days of Exercise per Week:   . Minutes of Exercise per Session:   Stress:   . Feeling of Stress :   Social Connections:   . Frequency of Communication with Friends and Family:   . Frequency of Social Gatherings with Friends and Family:   . Attends Religious Services:   . Active Member of Clubs or Organizations:   . Attends Banker Meetings:   Marland Kitchen Marital Status:   Intimate Partner Violence:   . Fear of Current or Ex-Partner:   . Emotionally Abused:   Marland Kitchen Physically Abused:   . Sexually Abused:     Review of Systems: 12 system ROS is negative except as noted above with the addition of allergies and depression.   Physical Exam: General:   Alert,  well-nourished,  pleasant and cooperative in NAD Head:  Normocephalic and atraumatic. Eyes:  Sclera clear, no icterus.   Conjunctiva pink. Ears:  Normal auditory acuity. Nose:  No deformity, discharge,  or lesions. Mouth:  No deformity or lesions.   Neck:  Supple; no masses or thyromegaly. Lungs:  Clear throughout to auscultation.   No wheezes. Heart:  Regular rate and rhythm; no murmurs. Abdomen:  Soft, pain localized to the xiphoid process but not reproducible on exam, nondistended, normal bowel sounds, no rebound or guarding. No hepatosplenomegaly.  Well healed surgical scars from recent cholecystectomy.  Rectal:  Deferred  Msk:  Symmetrical. No boney deformities LAD: No inguinal or umbilical LAD Extremities:  No clubbing or edema. Neurologic:  Alert and  oriented x4;  grossly nonfocal Skin:  Intact without significant lesions or rashes. Psych:  Alert and cooperative. Normal mood and affect.    Russel Morain L. Orvan Falconer, MD, MPH 06/15/2020, 2:19 PM

## 2020-06-22 ENCOUNTER — Other Ambulatory Visit: Payer: Self-pay | Admitting: Gastroenterology

## 2020-06-28 ENCOUNTER — Ambulatory Visit: Payer: 59 | Admitting: Family Medicine

## 2020-07-04 NOTE — Progress Notes (Signed)
   Subjective:    Patient ID: Yesenia Johnson, female    DOB: December 26, 1985, 34 y.o.   MRN: [id number]  HPI Chief Complaint  Patient presents with  . follow-up    follow-up on nuvaring. and rash on arms and legs and stomach x a month   Here to follow up on chronic health conditions and with a new complaint of a resolving rash x 1 month.   Complains of rash that she believes was due to poison ivy. Rash was on her bilateral arms and then spread to her right upper leg. She reports having bruising around the rash from scratching.  States she thinks her chickens may have the oil from the plants on them and she keeps being exposed. She is good about hand washing after touching them.   Depression- states she felt severely depressed over the past month but this improved after removing her Nuvaring.  She also  increased her dose of Effexor 3 weeks ago to see if this would help and it did. She has since gone back to her original dose.  She is not sure if the hormones from the Nuvaring caused her to plunge into the deep depression or not but she is back to baseline. Does not want to use the Nuvaring again.  States her husband is getting a vasectomy soon.   Vitamin D def- taking 2,000 IUs daily.   Taking 2.5 mg Melatonin at night. Sleep is not great but she does sleep with her dogs.   No fever, chills, chest pain, shortness of breath, N/V/D.   No other concerns.     Review of Systems Pertinent positives and negatives in the history of present illness.     Objective:   Physical Exam BP 120/70   Pulse 82   Temp 98.2 F (36.8 C)   Wt 155 lb 6.4 oz (70.5 kg)   SpO2 99%   BMI 25.08 kg/m   Red flat rash to her right anterior thigh with scaling. Bruises surrounding the rash are yellowish consistent with healing. No sign of infection.       Assessment & Plan:  Depression, unspecified depression type  Vitamin D deficiency  Pruritic rash  Reviewed recent labs.  I am concerned that her  mood may have been affected by the hormones and she and I agree to hold off on the Nuvaring for now. Her husband is getting a vasectomy.  For now, she will continue on Effexor XR 75 mg. Continue counseling.  Continue taking a vitamin D supplement.  Rash is resolving as well as the bruising. Try to avoid triggers. Follow up if rash is worsening.

## 2020-07-05 ENCOUNTER — Encounter: Payer: Self-pay | Admitting: Family Medicine

## 2020-07-05 ENCOUNTER — Ambulatory Visit: Payer: 59 | Admitting: Family Medicine

## 2020-07-05 ENCOUNTER — Other Ambulatory Visit: Payer: Self-pay

## 2020-07-05 VITALS — BP 120/70 | HR 82 | Temp 98.2°F | Wt 155.4 lb

## 2020-07-05 DIAGNOSIS — F329 Major depressive disorder, single episode, unspecified: Secondary | ICD-10-CM

## 2020-07-05 DIAGNOSIS — E559 Vitamin D deficiency, unspecified: Secondary | ICD-10-CM | POA: Diagnosis not present

## 2020-07-05 DIAGNOSIS — L282 Other prurigo: Secondary | ICD-10-CM | POA: Diagnosis not present

## 2020-07-05 DIAGNOSIS — F32A Depression, unspecified: Secondary | ICD-10-CM

## 2020-07-10 ENCOUNTER — Other Ambulatory Visit: Payer: Self-pay | Admitting: Gastroenterology

## 2020-07-10 NOTE — Telephone Encounter (Signed)
May refill her dicyclomine for 3 months.  However, at the time of her consultation we discussed either proceeding with 1) EGD with esophageal, gastric, and duodenal biopsies or 2) TTGA and IGA and H pylori testing if not proceeding with EGD.  Please make arrangements as she prefers.  Thank you.

## 2020-07-16 ENCOUNTER — Other Ambulatory Visit: Payer: Self-pay

## 2020-07-16 ENCOUNTER — Ambulatory Visit: Payer: 59 | Admitting: Family Medicine

## 2020-07-16 ENCOUNTER — Encounter: Payer: Self-pay | Admitting: Family Medicine

## 2020-07-16 VITALS — BP 110/72 | HR 76 | Temp 97.6°F | Wt 157.0 lb

## 2020-07-16 DIAGNOSIS — L282 Other prurigo: Secondary | ICD-10-CM | POA: Diagnosis not present

## 2020-07-16 DIAGNOSIS — J3089 Other allergic rhinitis: Secondary | ICD-10-CM

## 2020-07-16 MED ORDER — TRIAMCINOLONE ACETONIDE 0.1 % EX CREA: 1.0000 "application " | TOPICAL_CREAM | Freq: Two times a day (BID) | CUTANEOUS | 0 refills | Status: DC

## 2020-07-16 NOTE — Progress Notes (Signed)
° °  Subjective:    Patient ID: Yesenia Johnson, female    DOB: Jun 04, 1986, 34 y.o.   MRN: [id number]  HPI Chief Complaint  Patient presents with   rash    rash- on arms, chest, neck. seen 8/12 for rash on leg and still there   Complains of a pruritic rash on her right anterior thigh that has not cleared and has been present for 2 months or so.  Yesterday she developed itching and a rash on her arms, chest, neck and left ear. States she was working in her yard again this past weekend. She was in contact with plants. Is not taking anything for her current rash.   Had a poison ivy dermatitis in May 2021. The rash cleared up completely.   She was using topical steroid. Antihistamine spray. Takes Benadryl at night prn  She recalls an allergy to a radish in 2018. Also has a penicillin allergy.   Seasonal allergies which has not been an issue this spring or summer.   No fever, chills, cough, shortness of breath, arthralgias, myalgias, or GI concerns.   Reviewed allergies, medications, past medical, surgical, family, and social history.    Review of Systems Pertinent positives and negatives in the history of present illness.     Objective:   Physical Exam BP 110/72    Pulse 76    Temp 97.6 F (36.4 C)    Wt 157 lb (71.2 kg)    BMI 25.34 kg/m   Pruritic rash that is flat, red with discreet maculopapules and patches on her neck, chest and bilateral arms. No sign of infection.       Assessment & Plan:  Pruritic rash - Plan: triamcinolone cream (KENALOG) 0.1 %  Environmental and seasonal allergies  Recommend avoiding triggers, plants and working in her yard. Try Xyzal and may use topical steroid as discussed. May try Singular if needed or referral to allergist or dermatologist in future if not improving. Take Benadryl prn itching.

## 2020-07-16 NOTE — Patient Instructions (Signed)
Try taking Xyzal over the counter once daily every day for the next 2-4 weeks.

## 2020-08-02 ENCOUNTER — Encounter: Payer: Self-pay | Admitting: Gastroenterology

## 2020-08-10 ENCOUNTER — Encounter: Payer: 59 | Admitting: Gastroenterology

## 2020-08-25 ENCOUNTER — Other Ambulatory Visit: Payer: Self-pay | Admitting: Family Medicine

## 2020-08-25 DIAGNOSIS — F32A Depression, unspecified: Secondary | ICD-10-CM

## 2020-08-27 NOTE — Telephone Encounter (Signed)
Pt has upcoming appt 

## 2020-09-09 NOTE — Progress Notes (Signed)
   Subjective:    Patient ID: Yesenia Johnson, female    DOB: November 24, 1986, 34 y.o.   MRN: [id number]  HPI Chief Complaint  Patient presents with  . 2 month follow-up    depression. going ok   She is here to follow up on depression. She was trying to wean off of Effexor XR. States she has been having several bad days. States she would like to increase to 150 mg XR as she was taking that dose and feeling well at that time.   She is in therapy now. Has appt tomorrow.   She stopped Nuvaring to see if the hormones were contributing to her worsening depression.   Vitamin D def- taking 2,000 IUs daily   Allergies- not bothersome now   Rash cleared up  Taking Bentyl as needed.   Denies fever, chills, dizziness, chest pain, palpitations, shortness of breath, abdominal pain, N/V/D, urinary symptoms, LE edema.      Review of Systems Pertinent positives and negatives in the history of present illness.     Objective:   Physical Exam BP 120/70   Pulse 86   Wt 161 lb 12.8 oz (73.4 kg)   BMI 26.12 kg/m   Alert and oriented and in no acute distress.  Respirations unlabored.  Normal speech, mood and thought process      Assessment & Plan:  Depression, unspecified depression type  Environmental and seasonal allergies  We will increase her Effexor dose and she will let me know how she is doing.  She is now in counseling and has an appointment tomorrow.  Otherwise, she is in her usual state of health.  No issues with allergies or rash any longer.  She will follow-up at her physical or sooner as needed

## 2020-09-10 ENCOUNTER — Encounter: Payer: Self-pay | Admitting: Family Medicine

## 2020-09-10 ENCOUNTER — Other Ambulatory Visit: Payer: Self-pay

## 2020-09-10 ENCOUNTER — Ambulatory Visit: Payer: 59 | Admitting: Family Medicine

## 2020-09-10 VITALS — BP 120/70 | HR 86 | Wt 161.8 lb

## 2020-09-10 DIAGNOSIS — F32A Depression, unspecified: Secondary | ICD-10-CM

## 2020-09-10 DIAGNOSIS — J3089 Other allergic rhinitis: Secondary | ICD-10-CM | POA: Diagnosis not present

## 2020-09-10 MED ORDER — VENLAFAXINE HCL ER 150 MG PO CP24: 150.0000 mg | ORAL_CAPSULE | Freq: Every day | ORAL | 5 refills | Status: DC

## 2020-10-10 ENCOUNTER — Telehealth: Payer: Self-pay

## 2020-10-10 NOTE — Telephone Encounter (Signed)
LOV 06/15/20. Plan: EGD with esophageal, gastric, and duodenal biopsies TTGA and IGA and H pylori testing if not proceeding with EGD  EGD was scheduled on 08/10/20 but it appears pt called to cancel EGD on 08/06/20. A letter was sent to pt informing her of about her out of pocket expense r/t EGD. Uncertain if this was the reason for her cancellation as this is not indicated in the notes of the canceled appt. In addition, appears labs were not completed per plan above IF pt did not have EGD.  Given this information, routing this message to Dr. Orvan Falconer to inquire if labs should be completed BEFORE 10/23/20 appt per her initial recommendation should pt NOT proceed with EGD.

## 2020-10-10 NOTE — Telephone Encounter (Signed)
Thank you. She should have labs or EGD prior to follow-up.

## 2020-10-11 ENCOUNTER — Other Ambulatory Visit: Payer: Self-pay

## 2020-10-11 DIAGNOSIS — R1013 Epigastric pain: Secondary | ICD-10-CM

## 2020-10-11 DIAGNOSIS — R140 Abdominal distension (gaseous): Secondary | ICD-10-CM

## 2020-10-11 NOTE — Telephone Encounter (Signed)
Pt reviewed and responded to my chart message. Orders have been placed and pt is aware to complete at her earliest convenience. Will continue to follow up to ensure pt has completed labs before 11/30 appt.

## 2020-10-11 NOTE — Telephone Encounter (Signed)
My Chart message has been sent to pt informing her about Dr. Orvan Falconer response. Follow up to ensure pt has reviewed My Chart message and responded. If not, will need to call pt to discuss further.

## 2020-10-23 ENCOUNTER — Ambulatory Visit: Payer: 59 | Admitting: Gastroenterology

## 2020-10-25 ENCOUNTER — Encounter: Payer: Self-pay | Admitting: Family Medicine

## 2020-10-29 ENCOUNTER — Encounter: Payer: Self-pay | Admitting: Family Medicine

## 2020-10-29 ENCOUNTER — Other Ambulatory Visit: Payer: Self-pay

## 2020-10-29 ENCOUNTER — Ambulatory Visit: Payer: 59 | Admitting: Family Medicine

## 2020-10-29 VITALS — BP 130/70 | HR 81 | Wt 155.6 lb

## 2020-10-29 DIAGNOSIS — R41840 Attention and concentration deficit: Secondary | ICD-10-CM | POA: Diagnosis not present

## 2020-10-29 DIAGNOSIS — G479 Sleep disorder, unspecified: Secondary | ICD-10-CM

## 2020-10-29 DIAGNOSIS — F32A Depression, unspecified: Secondary | ICD-10-CM | POA: Diagnosis not present

## 2020-10-29 DIAGNOSIS — F419 Anxiety disorder, unspecified: Secondary | ICD-10-CM | POA: Diagnosis not present

## 2020-10-29 MED ORDER — VENLAFAXINE HCL ER 75 MG PO CP24: 75.0000 mg | ORAL_CAPSULE | Freq: Every day | ORAL | 1 refills | Status: DC

## 2020-10-29 NOTE — Progress Notes (Signed)
Subjective:    Patient ID: Yesenia Johnson, female    DOB: 1986/05/10, 34 y.o.   MRN: [id number]  HPI Chief Complaint  Patient presents with  . discuss depression    discuss depression med-   She is here with worsening depression over the past few weeks.  Since her last visit in October when we increased her Effexor, she reports her symptoms are actually worse. States she is unable to concentrate at work. She is waking up early. States she feels that she has anxiety now which is new.  Stressful situation with a foster dog who will be transferred to another home soon.  She saw a therapist twice virtually but it did not go well.  States the therapist was working from home and she could see family members walking by in the background.  Reports having "intrusive thoughts" such as what it would be like to be in a car accident or dying in someway.  She does not have a plan to harm herself and states she would not do this.  States she would seek help immediately if it came to that.  States she has also shared these thoughts with her husband.  She is not self-medicating.  Denies fever, chills, chest pain, palpitation, shortness of breath, abdominal pain, nausea, vomiting or diarrhea.  LMP: 2 weeks ago  Husband had a vasectomy. Using condoms also     Depression screen [facility] 2/9 10/29/2020 09/10/2020 04/26/2020 09/21/2019 09/06/2019  Decreased Interest 2 1 0 0 3  Down, Depressed, Hopeless 2 1 0 0 0  PHQ - 2 Score 4 2 0 0 3  Altered sleeping 2 3 - - 1  Tired, decreased energy 2 1 - - 2  Change in appetite 0 2 - - 2  Feeling bad or failure about yourself  1 0 - - 0  Trouble concentrating 3 0 - - 0  Moving slowly or fidgety/restless 2 0 - - 1  Suicidal thoughts 1 0 - - 0  PHQ-9 Score 15 8 - - 9  Difficult doing work/chores Very difficult Somewhat difficult - - Somewhat difficult  Some recent data might be hidden    Reviewed allergies, medications, past medical, surgical, family, and  social history.    Review of Systems Pertinent positives and negatives in the history of present illness.     Objective:   Physical Exam BP 130/70   Pulse 81   Wt 155 lb 9.6 oz (70.6 kg)   BMI 25.11 kg/m   Alert and in no distress. Tearful and depressed mood.  Cardiac exam shows a regular rhythm without murmurs or gallops. Lungs are clear to auscultation. Respirations unlabored. Skin is warm and dry. Normal speech, memory and thought process. SI without plan.       Assessment & Plan:  Depression, unspecified depression type - Plan: venlafaxine XR (EFFEXOR XR) 75 MG 24 hr capsule  Anxiety - Plan: venlafaxine XR (EFFEXOR XR) 75 MG 24 hr capsule  Difficulty concentrating  Sleep disturbances  She will stop Effexor 150 mg and start on the lower dose as she was taking prior to October 2021. Worsening depression and now also having some anxiety.  She is having difficulty sleeping and concentrating and her mood is affecting her home life, work and socially now.  She has had some "intrusive thoughts about being in a car accident" or dying but has no plan to harm herself and states she would not actually do this.  Verbal contract she  will not harm herself but would actually go to [facility] emergency department or [facility] if she worsens in any way.  She does not appear to be self-medicating and does not appear to be in any immediate danger. She will call and schedule with a psychiatrist and I did provide her with a list of names. We discussed checking labs but after reviewing her blood work from June decided to not pursue blood work. She will call back here with any questions or concerns and follow-up with me in 3 months or sooner if needed.Marland Kitchen

## 2020-10-29 NOTE — Patient Instructions (Addendum)
You can call to schedule your appointment with the psychiatrist/counselor. A few offices are listed below for you to call.   [facility]  [phone]    [facility]  [address], [place], [place] [address]  Phone: [phone]   [facility] [address] [place], [place] [address]  Phone: [phone] [facility]  Ask for a psychiatrist  [address]  (across from [facility])  [phone]    [facility] [address] Sonny Masters [place], [place] [address] [phone]      If in crisis  [facility]  [address] [place], [place] [address] [phone]

## 2020-11-19 ENCOUNTER — Encounter: Payer: Self-pay | Admitting: Family Medicine

## 2020-11-20 ENCOUNTER — Telehealth: Payer: 59 | Admitting: Medical

## 2020-11-20 VITALS — Temp 97.4°F | Wt 155.0 lb

## 2020-11-20 DIAGNOSIS — R509 Fever, unspecified: Secondary | ICD-10-CM

## 2020-11-20 DIAGNOSIS — R059 Cough, unspecified: Secondary | ICD-10-CM | POA: Diagnosis not present

## 2020-11-20 NOTE — Progress Notes (Signed)
Subjective:     Patient ID: Yesenia Johnson, female   DOB: March 14, 1986, 34 y.o.   MRN: [id number]  This visit type was conducted due to national recommendations for restrictions regarding the COVID-19 Pandemic (e.g. social distancing) in an effort to limit this patient's exposure and mitigate transmission in our community.  Due to their co-morbid illnesses, this patient is at least at moderate risk for complications without adequate follow up.  This format is felt to be most appropriate for this patient at this time.    Documentation for virtual audio and video telecommunications through [facility] encounter:  The patient was located at home. The provider was located in the office. The patient did consent to this visit and is aware of possible charges through their insurance for this visit.  The other persons participating in this telemedicine service were none. Time spent on call was 20 minutes and in review of previous records 20 minutes total.  This virtual service is not related to other E/M service within previous 7 days.   HPI Chief Complaint  Patient presents with  . sick    Symptoms started last Wednesday- fatigue, fever Thursday 100.6, cough, sore throat, SOB, congested, headache, loss of taste, pcr pending covid 12/27   Virtual consult today.  Started getting sick last Wednesday about 6 days ago.   Felt off that day.  Then the next day started with sore throat, fever 100.6.   The next day started coughing and sore throat continued.  Symptoms have persisted  With cough, lots of head congestion, lots of headaches, and as of this morning starting to lose taste.   Had a covid test yesterday PCR, still pending results.  Using dayqul and nyquil.  Using no other remedies.  No sick contacts.  Has been vaccinated against covid.  Compared to the last few days can't breath through nostril now.   But up moving around feels tired, breathing a little more labored.   Feels exhausted in general but  is better today compared to a few days ago.  Is a nonsmoker, no other significant health issues.      Past Medical History:  Diagnosis Date  . Depression    Review of Systems As in subjective    Objective:   Physical Exam Due to coronavirus pandemic stay at home measures, patient visit was virtual and they were not examined in person.   Temp (!) 97.4 F (36.3 C)   Wt 155 lb (70.3 kg)   BMI 25.02 kg/m  General: Well-developed well-nourished no acute distress white female Psych: Answers questions appropriately No obvious dyspnea or wheezing Mildly ill-appearing     Assessment:     Encounter Diagnoses  Name Primary?  . Cough Yes  . Fever, unspecified fever cause        Plan:     We discussed her recent symptoms which could represent viral syndrome versus Covid.  Her PCR Covid test results should be back in the next few days.  Her symptoms started 6 days ago.  We discussed quarantine in the new CDC guidelines it came out yesterday.  If she is symptom-free by Thursday she could resume going back to work with a mask for an additional 5 days.  She still has some symptoms today but is starting to improve.  At this point we discussed continued hydration, rest, she can continue over-the-counter remedies she is using and give her body time to recover  If any new or worsening symptoms call back.  We discussed calling back if positive for Covid if she has other questions or if she needs a work note on when to end the quarantine  Jessel was seen today for sick.  Diagnoses and all orders for this visit:  Cough  Fever, unspecified fever cause  f/u prn

## 2020-11-22 ENCOUNTER — Telehealth: Payer: 59 | Admitting: Family Medicine

## 2020-11-22 ENCOUNTER — Encounter: Payer: Self-pay | Admitting: Family Medicine

## 2020-11-22 VITALS — Temp 98.0°F | Wt 155.0 lb

## 2020-11-22 DIAGNOSIS — R5383 Other fatigue: Secondary | ICD-10-CM

## 2020-11-22 DIAGNOSIS — R0789 Other chest pain: Secondary | ICD-10-CM | POA: Diagnosis not present

## 2020-11-22 DIAGNOSIS — R062 Wheezing: Secondary | ICD-10-CM

## 2020-11-22 DIAGNOSIS — R059 Cough, unspecified: Secondary | ICD-10-CM | POA: Diagnosis not present

## 2020-11-22 DIAGNOSIS — U071 COVID-19: Secondary | ICD-10-CM

## 2020-11-22 MED ORDER — ALBUTEROL SULFATE HFA 108 (90 BASE) MCG/ACT IN AERS: 2.0000 | INHALATION_SPRAY | Freq: Four times a day (QID) | RESPIRATORY_TRACT | 0 refills | Status: DC | PRN

## 2020-11-22 NOTE — Progress Notes (Signed)
   Subjective:  Documentation for virtual audio and video telecommunications through Caregility encounter:  The patient was located at home. 2 patient identifiers used.  The provider was located in the office. The patient did consent to this visit and is aware of possible charges through their insurance for this visit.  The other persons participating in this telemedicine service were none. Time spent on call was 16 minutes and in review of previous records >20 minutes total.  This virtual service is not related to other E/M service within previous 7 days.   Patient ID: Yesenia Johnson, female    DOB: 1986/06/11, 34 y.o.   MRN: [id number]  HPI Chief Complaint  Patient presents with  . Covid Positive    Positive covid, lingering cough and SOB, and congestion. Burning sensation in chest. Fatigue, headache x 9 days.    Complains of a 9 day history of dry cough, wheezing, chest congestion and fatigue. Tested positive 2 days ago for Covid.  She feels like she is getting worse. Having issues taking a deep breath and a burning sensation in her chest. States her symptoms began with fatigue, sore throat, cough, temp was 100.6. states she started feeling better a few days ago but has been worse over the past 2 days.  States she has a poor appetite. No loss of taste of smell.   Denies fever, chills, dizziness, abdominal pain, N/V/D.    She did not get her Covid booster but had her Pfizer boosters in April.   Poor appetite  She was taking Dayqil and Nyquil the first 4 days but lately has been   She did a virtual visit with Kristian Covey, PA, 2 days ago. They discussed symptomatic management of viral illness including Covid.   Reviewed allergies, medications, past medical, surgical, family, and social history.  Review of Systems Pertinent positives and negatives in the history of present illness.     Objective:   Physical Exam Temp 98 F (36.7 C)   Wt 155 lb (70.3 kg)   BMI 25.02  kg/m   Alert and oriented and in no acute distress. Respirations unlabored. Speaking in complete sentences without difficulty.       Assessment & Plan:  COVID-19 virus infection  Cough - Plan: albuterol (VENTOLIN HFA) 108 (90 Base) MCG/ACT inhaler  Wheezing - Plan: albuterol (VENTOLIN HFA) 108 (90 Base) MCG/ACT inhaler  Chest tightness - Plan: albuterol (VENTOLIN HFA) 108 (90 Base) MCG/ACT inhaler  Fatigue, unspecified type  Discussed symptomatic treatment for Covid infection.  Discussed red flag symptoms and when to seek medical attention at urgent care or the emergency department.  I will prescribe albuterol inhaler for her.  Try Mucinex DM or Robitussin-DM.  Discussed quarantine.  I will provide her with a note for work as needed.  We will need to follow-up on Monday and see how she is improving and determine when she may return to work safely.

## 2020-11-26 ENCOUNTER — Encounter: Payer: Self-pay | Admitting: Family Medicine

## 2020-11-26 ENCOUNTER — Other Ambulatory Visit: Payer: Self-pay

## 2020-11-26 ENCOUNTER — Telehealth: Payer: 59 | Admitting: Family Medicine

## 2020-11-26 VITALS — HR 87

## 2020-11-26 DIAGNOSIS — R0602 Shortness of breath: Secondary | ICD-10-CM

## 2020-11-26 DIAGNOSIS — U071 COVID-19: Secondary | ICD-10-CM

## 2020-11-26 DIAGNOSIS — R058 Other specified cough: Secondary | ICD-10-CM

## 2020-11-26 MED ORDER — PREDNISONE 10 MG (21) PO TBPK: ORAL_TABLET | Freq: Every day | ORAL | 0 refills | Status: DC

## 2020-11-26 MED ORDER — AZITHROMYCIN 250 MG PO TABS: ORAL_TABLET | ORAL | 0 refills | Status: DC

## 2020-11-26 NOTE — Progress Notes (Signed)
done

## 2020-11-26 NOTE — Progress Notes (Signed)
   Subjective:  Documentation for virtual audio and video telecommunications through Caregility encounter:  The patient was located at home. 2 patient identifiers used.  The provider was located in the office. The patient did consent to this visit and is aware of possible charges through their insurance for this visit.  The other persons participating in this telemedicine service were none. Time spent on call was 14 minutes and in review of previous records >20 minutes total.  This virtual service is not related to other E/M service within previous 7 days.   Patient ID: Yesenia Johnson, female    DOB: [birth year], 35 y.o.   MRN: [id number]  HPI Chief Complaint  Patient presents with  . covid    Follow-up on covid. Not doing well today. Still having cough and SOB. Was feeling better today.    This is a follow up on Covid infection. This is day 13 of her illness. States she felt better yesterday and then woke up with a more severe and congested cough. She is having shortness of breath and has been using albuterol as I prescribed. She is also treating her symptoms with several OTC medications as I recommended.   States her energy level is poor.  No longer having fever, chills.  No vomiting or diarrhea.  Symptom onset 11/14/2020.   She is able to work from home but needs a work note stating she should do this.     Review of Systems Pertinent positives and negatives in the history of present illness.     Objective:   Physical Exam Pulse 87   SpO2 98%   Alert and oriented and in no acute distress.  Able to speak in complete sentences without difficulty.  Normal speech, mood, thought process.      Assessment & Plan:  COVID-19 virus infection  Productive cough - Plan: azithromycin (ZITHROMAX) 250 MG tablet, predniSONE (STERAPRED UNI-PAK 21 TAB) 10 MG (21) TBPK tablet  Shortness of breath - Plan: azithromycin (ZITHROMAX) 250 MG tablet, predniSONE (STERAPRED UNI-PAK 21 TAB) 10 MG  (21) TBPK tablet  Discussed that she may now have a bacterial infection since she was improving and this is day 13 of her illness.  I will prescribe azithromycin and a course of prednisone.  She will continue staying hydrated and doing symptomatic treatment.  She will follow-up if worsening or if she is not noticing significant improvement in the next 2 to 3 days. I will provide her with a note stating she should work from home for the next 3 days.

## 2020-12-03 ENCOUNTER — Ambulatory Visit (INDEPENDENT_AMBULATORY_CARE_PROVIDER_SITE_OTHER): Payer: 59 | Admitting: Family Medicine

## 2020-12-03 VITALS — BP 118/86 | HR 88 | Temp 97.6°F

## 2020-12-03 DIAGNOSIS — R062 Wheezing: Secondary | ICD-10-CM

## 2020-12-03 DIAGNOSIS — U071 COVID-19: Secondary | ICD-10-CM

## 2020-12-03 DIAGNOSIS — R059 Cough, unspecified: Secondary | ICD-10-CM | POA: Diagnosis not present

## 2020-12-03 DIAGNOSIS — R0789 Other chest pain: Secondary | ICD-10-CM

## 2020-12-03 MED ORDER — BENZONATATE 100 MG PO CAPS: 100.0000 mg | ORAL_CAPSULE | Freq: Two times a day (BID) | ORAL | 0 refills | Status: DC | PRN

## 2020-12-03 MED ORDER — ALBUTEROL SULFATE HFA 108 (90 BASE) MCG/ACT IN AERS: 2.0000 | INHALATION_SPRAY | Freq: Four times a day (QID) | RESPIRATORY_TRACT | 11 refills | Status: DC | PRN

## 2020-12-03 NOTE — Progress Notes (Signed)
[facility]   New Patient  Subjective:  Patient ID: Yesenia Johnson, female    DOB: Sep 30, 1986  Age: 35 y.o. MRN: [id number]  CC:  Chief Complaint  Patient presents with  . Covid Positive    Pt COVID positive, 1/3 increased and cough    HPI Yesenia Johnson is a 35 year old female who presents for Follow Up of Covid19 Virus Infection today.    Patient Active Problem List   Diagnosis Date Noted  . Anxiety 10/29/2020  . Difficulty concentrating 10/29/2020  . Sleep disturbances 10/29/2020  . Acute cholecystitis 03/23/2020  . Dysuria 06/14/2018  . Abnormal urinalysis 06/14/2018  . Counseling on health promotion and disease prevention 01/21/2018  . Nutritional counseling 01/21/2018  . Depression 06/16/2017  . Overweight (BMI 25.0-29.9) 06/16/2017  . Environmental and seasonal allergies 06/16/2017  . Family history of diabetes mellitus in mother 06/16/2017  . Family history of hypertension- mom 06/16/2017  . Family history of breast cancer- in M aunt in her early 49's 06/16/2017  . Pap smear of cervix shows high risk HPV present- in past, had colposcopy, 06/16/2017   Current Status: Since her last office Video Consult Visit on 11/22/2020 for Covid19 Virus, she is doing well and stable.  Cough: Stable.  Wheezing: Resolved  Chest Tightness: Resolved   Fatigue: Increased  She denies fevers, chills, fatigue, recent infections, weight loss, and night sweats. She has not had any headaches, visual changes, dizziness, and falls. No chest pain, and heart palpitations reported. Denies GI problems such as nausea, vomiting, diarrhea, and constipation. She has no reports of blood in stools, dysuria and hematuria. Anxiety is moderate today, she will follow up with counselor as needed. She is taking all medications as prescribed. She denies pain today.   Past Medical History:  Diagnosis Date  . Depression     Past Surgical History:  Procedure Laterality Date   . CHOLECYSTECTOMY N/A 03/24/2020   Procedure: LAPAROSCOPIC CHOLECYSTECTOMY WITH INTRAOPERATIVE CHOLANGIOGRAM;  Surgeon: Darnell Level, MD;  Location: [facility];  Service: General;  Laterality: N/A;  . COLPOSCOPY    . WISDOM TOOTH EXTRACTION  11/2016    Family History  Problem Relation Age of Onset  . Hypertension Mother   . Diabetes Mother   . Stroke Maternal Grandmother   . Diabetes Paternal Grandmother   . Breast cancer Maternal Aunt   . Alcoholism Maternal Aunt   . Diabetes Paternal Aunt     Social History   Socioeconomic History  . Marital status: Married    Spouse name: Not on file  . Number of children: Not on file  . Years of education: Not on file  . Highest education level: Not on file  Occupational History  . Not on file  Tobacco Use  . Smoking status: Never Smoker  . Smokeless tobacco: Never Used  Vaping Use  . Vaping Use: Never used  Substance and Sexual Activity  . Alcohol use: Yes    Alcohol/week: 2.0 standard drinks    Types: 2 Cans of beer per week    Comment: 3-4 drinks per week   . Drug use: No  . Sexual activity: Yes    Partners: Male    Birth control/protection: None  Other Topics Concern  . Not on file  Social History Narrative  . Not on file   Social Determinants of Health   Financial Resource Strain: Not on file  Food Insecurity: Not on file  Transportation Needs: Not  on file  Physical Activity: Not on file  Stress: Not on file  Social Connections: Not on file  Intimate Partner Violence: Not on file    Outpatient Medications Prior to Visit  Medication Sig Dispense Refill  . dicyclomine (BENTYL) 20 MG tablet TAKE 1 TABLET (20 MG TOTAL) BY MOUTH 4 (FOUR) TIMES DAILY AS NEEDED FOR SPASMS. 360 tablet 1  . venlafaxine XR (EFFEXOR XR) 75 MG 24 hr capsule Take 1 capsule (75 mg total) by mouth daily with breakfast. 30 capsule 1  . azithromycin (ZITHROMAX) 250 MG tablet Take 2 tablets on day 1, then 1 tablet on days 2-5. (Patient not taking:  Reported on 12/03/2020) 6 tablet 0  . predniSONE (STERAPRED UNI-PAK 21 TAB) 10 MG (21) TBPK tablet Take by mouth daily. Tapering dose as recommended. (Patient not taking: Reported on 12/03/2020) 21 tablet 0  . albuterol (VENTOLIN HFA) 108 (90 Base) MCG/ACT inhaler Inhale 2 puffs into the lungs every 6 (six) hours as needed for wheezing or shortness of breath. (Patient not taking: Reported on 12/03/2020) 8 g 0   No facility-administered medications prior to visit.    Allergies  Allergen Reactions  . Penicillins Hives    Did it involve swelling of the face/tongue/throat, SOB, or low BP? Y Did it involve sudden or severe rash/hives, skin peeling, or any reaction on the inside of your mouth or nose? Y Did you need to seek medical attention at a hospital or doctor's office? Y When did it last happen?infancy If all above answers are "NO", may proceed with cephalosporin use.     ROS Review of Systems  Constitutional: Positive for appetite change (decreased) and fatigue.  Respiratory: Positive for cough (occasional) and shortness of breath (occasional ).   Cardiovascular:       Effort of breathing as 'tired.'  Genitourinary: Enuresis: occasional   Musculoskeletal: Negative.   Neurological: Positive for light-headedness and headaches (occasional ).  Psychiatric/Behavioral: Negative.    Objective:    Physical Exam Vitals and nursing note reviewed.  Constitutional:      Appearance: Normal appearance.  Cardiovascular:     Rate and Rhythm: Normal rate and regular rhythm.  Pulmonary:     Effort: Pulmonary effort is normal.     Breath sounds: Normal breath sounds.  Skin:    General: Skin is warm and dry.  Neurological:     General: No focal deficit present.     Mental Status: She is alert and oriented to person, place, and time.  Psychiatric:        Mood and Affect: Mood normal.        Behavior: Behavior normal.        Thought Content: Thought content normal.        Judgment:  Judgment normal.     BP 118/86 (BP Location: Left Arm, Patient Position: Sitting, Cuff Size: Normal)   Pulse 88   Temp 97.6 F (36.4 C) (Temporal)   SpO2 98% Comment: per provider pt walked up and down hall to obtain active O2 Wt Readings from Last 3 Encounters:  11/22/20 155 lb (70.3 kg)  11/20/20 155 lb (70.3 kg)  10/29/20 155 lb 9.6 oz (70.6 kg)     Health Maintenance Due  Topic Date Due  . Hepatitis C Screening  Never done  . COVID-19 Vaccine (3 - Booster for Pfizer series) 09/19/2020    There are no preventive care reminders to display for this patient.  Lab Results  Component Value Date  TSH 3.490 04/26/2020   Lab Results  Component Value Date   WBC 6.8 04/26/2020   HGB 14.0 04/26/2020   HCT 42.4 04/26/2020   MCV 90 04/26/2020   PLT 319 04/26/2020   Lab Results  Component Value Date   NA 135 04/26/2020   K 4.5 04/26/2020   CO2 21 04/26/2020   GLUCOSE 89 04/26/2020   BUN 8 04/26/2020   CREATININE 0.85 04/26/2020   BILITOT 0.9 04/26/2020   ALKPHOS 91 04/26/2020   AST 24 04/26/2020   ALT 27 04/26/2020   PROT 7.2 04/26/2020   ALBUMIN 4.1 04/26/2020   CALCIUM 9.4 04/26/2020   ANIONGAP 7 03/24/2020   Lab Results  Component Value Date   CHOL 195 04/26/2020   Lab Results  Component Value Date   HDL 63 04/26/2020   Lab Results  Component Value Date   LDLCALC 118 (H) 04/26/2020   Lab Results  Component Value Date   TRIG 76 04/26/2020   Lab Results  Component Value Date   CHOLHDL 3.1 04/26/2020   Lab Results  Component Value Date   HGBA1C 5.1 04/26/2020    Assessment & Plan:   1. COVID-19 virus infection  2. Cough - albuterol (VENTOLIN HFA) 108 (90 Base) MCG/ACT inhaler; Inhale 2 puffs into the lungs every 6 (six) hours as needed for wheezing or shortness of breath.  Dispense: 8 g; Refill: 11  3. Wheezing - albuterol (VENTOLIN HFA) 108 (90 Base) MCG/ACT inhaler; Inhale 2 puffs into the lungs every 6 (six) hours as needed for wheezing  or shortness of breath.  Dispense: 8 g; Refill: 11  Meds ordered this encounter  Medications  . benzonatate (TESSALON) 100 MG capsule    Sig: Take 1 capsule (100 mg total) by mouth 2 (two) times daily as needed for cough.    Dispense:  20 capsule    Refill:  0  . albuterol (VENTOLIN HFA) 108 (90 Base) MCG/ACT inhaler    Sig: Inhale 2 puffs into the lungs every 6 (six) hours as needed for wheezing or shortness of breath.    Dispense:  8 g    Refill:  11    No orders of the defined types were placed in this encounter.   Referral Orders  No referral(s) requested today    Raliegh Ip, MSN, ANE, FNP-BC [facility] [address] [place], [place] [address] [phone] fax   Problem List Items Addressed This Visit   None   Visit Diagnoses    COVID-19 virus infection    -  Primary   Cough       Relevant Medications   albuterol (VENTOLIN HFA) 108 (90 Base) MCG/ACT inhaler   Wheezing       Relevant Medications   albuterol (VENTOLIN HFA) 108 (90 Base) MCG/ACT inhaler      Meds ordered this encounter  Medications  . benzonatate (TESSALON) 100 MG capsule    Sig: Take 1 capsule (100 mg total) by mouth 2 (two) times daily as needed for cough.    Dispense:  20 capsule    Refill:  0  . albuterol (VENTOLIN HFA) 108 (90 Base) MCG/ACT inhaler    Sig: Inhale 2 puffs into the lungs every 6 (six) hours as needed for wheezing or shortness of breath.    Dispense:  8 g    Refill:  11    Follow-up: No follow-ups on file.  Azzie Glatter, FNP

## 2020-12-05 ENCOUNTER — Ambulatory Visit: Payer: 59

## 2020-12-07 ENCOUNTER — Encounter: Payer: Self-pay | Admitting: Family Medicine

## 2020-12-14 ENCOUNTER — Other Ambulatory Visit: Payer: Self-pay | Admitting: Family Medicine

## 2020-12-14 DIAGNOSIS — R059 Cough, unspecified: Secondary | ICD-10-CM

## 2020-12-14 DIAGNOSIS — R062 Wheezing: Secondary | ICD-10-CM

## 2020-12-15 ENCOUNTER — Other Ambulatory Visit: Payer: Self-pay | Admitting: Family Medicine

## 2020-12-15 DIAGNOSIS — R062 Wheezing: Secondary | ICD-10-CM

## 2020-12-15 DIAGNOSIS — R059 Cough, unspecified: Secondary | ICD-10-CM

## 2020-12-17 NOTE — Telephone Encounter (Signed)
This was refilled by [facility] on 12/03/20 for a year

## 2020-12-23 ENCOUNTER — Other Ambulatory Visit: Payer: Self-pay | Admitting: Family Medicine

## 2020-12-23 DIAGNOSIS — F32A Depression, unspecified: Secondary | ICD-10-CM

## 2020-12-23 DIAGNOSIS — F419 Anxiety disorder, unspecified: Secondary | ICD-10-CM

## 2020-12-24 NOTE — Telephone Encounter (Signed)
Is this okay to refill? 

## 2021-04-29 ENCOUNTER — Encounter: Payer: 59 | Admitting: Family Medicine

## 2021-07-07 IMAGING — DX DG CHEST 2V
2 series · 2 of 2 positions shown · non-contrast
Comparison: None available.

CLINICAL DATA: Initial evaluation for acute chest pain.

EXAM:
CHEST - 2 VIEW

[chest pa]
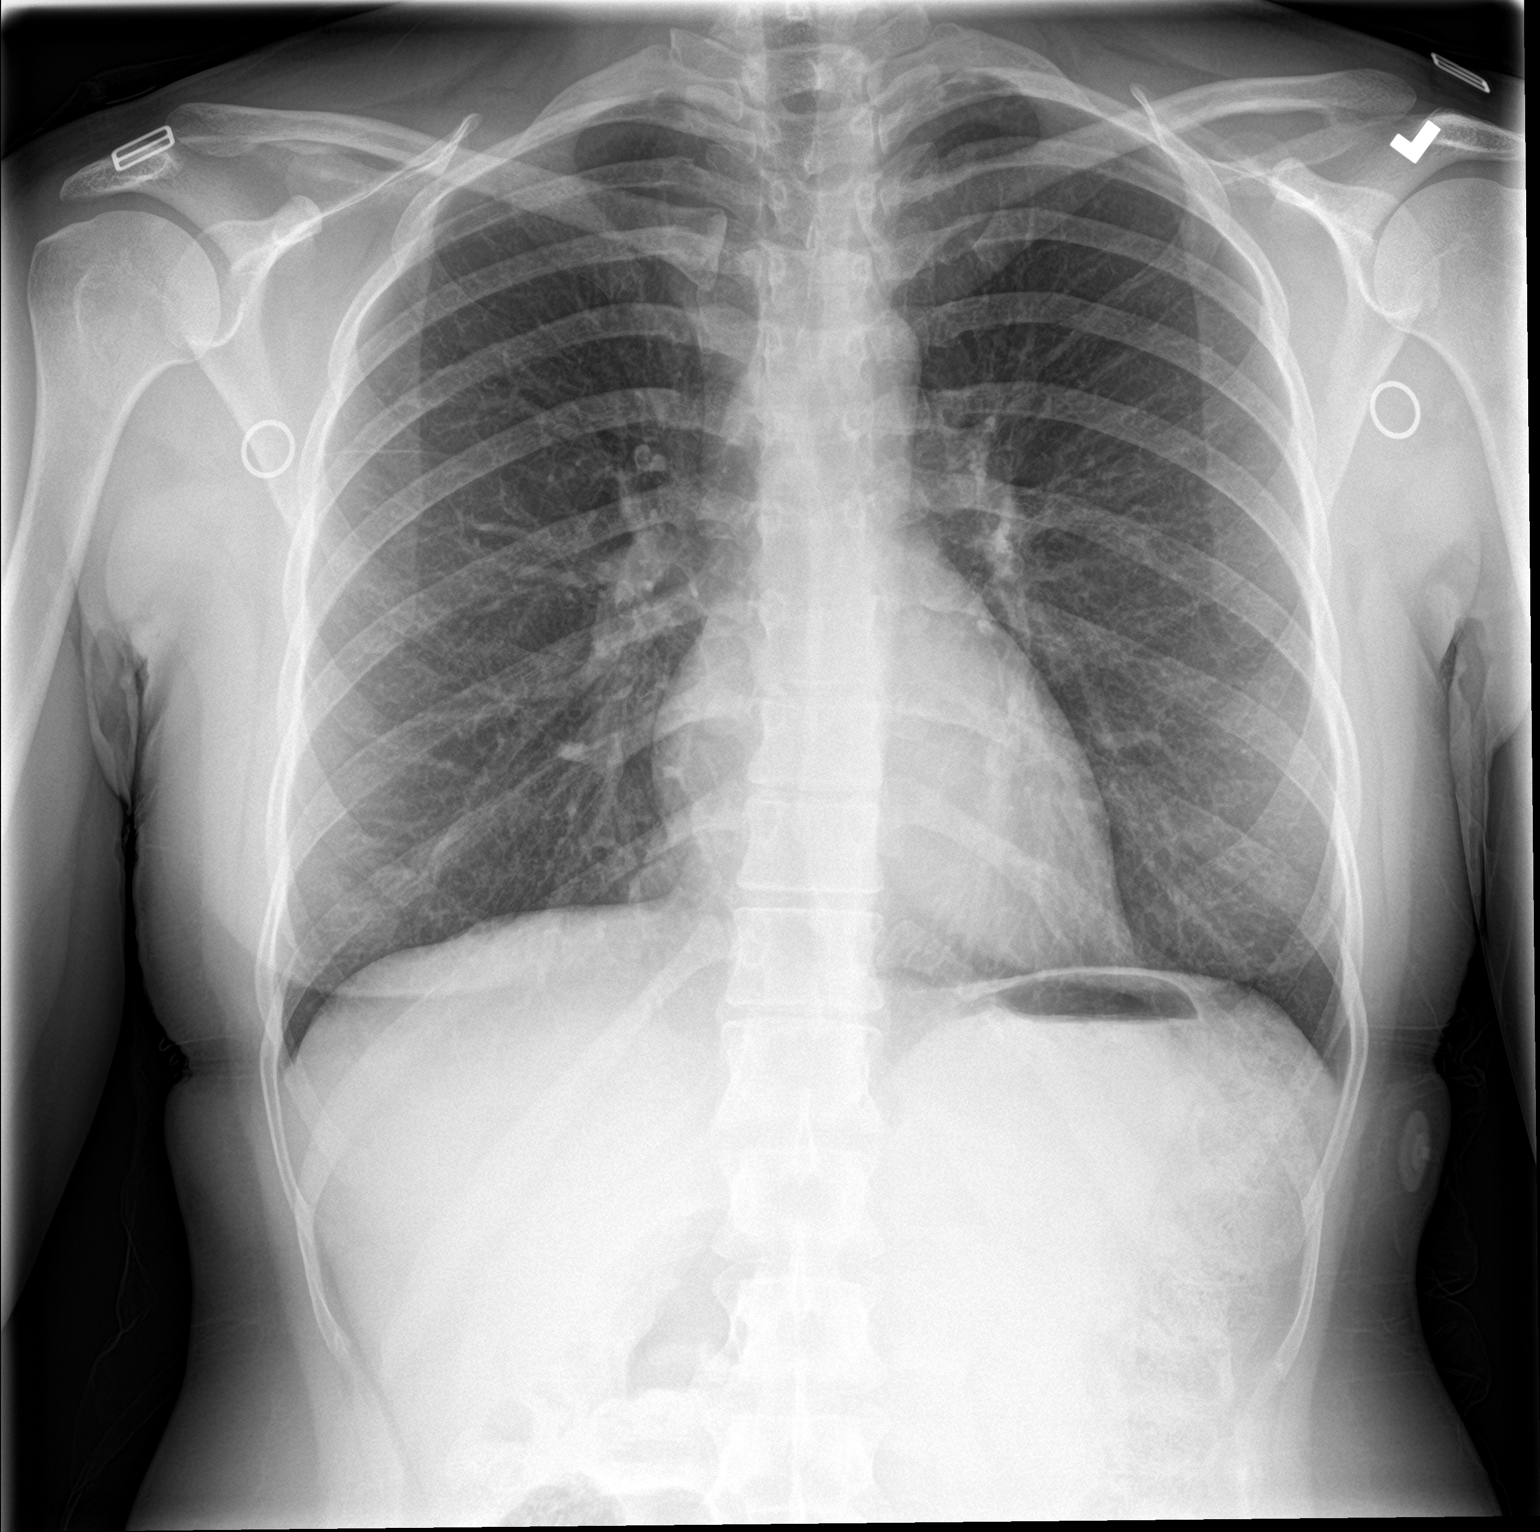

[chest lat]
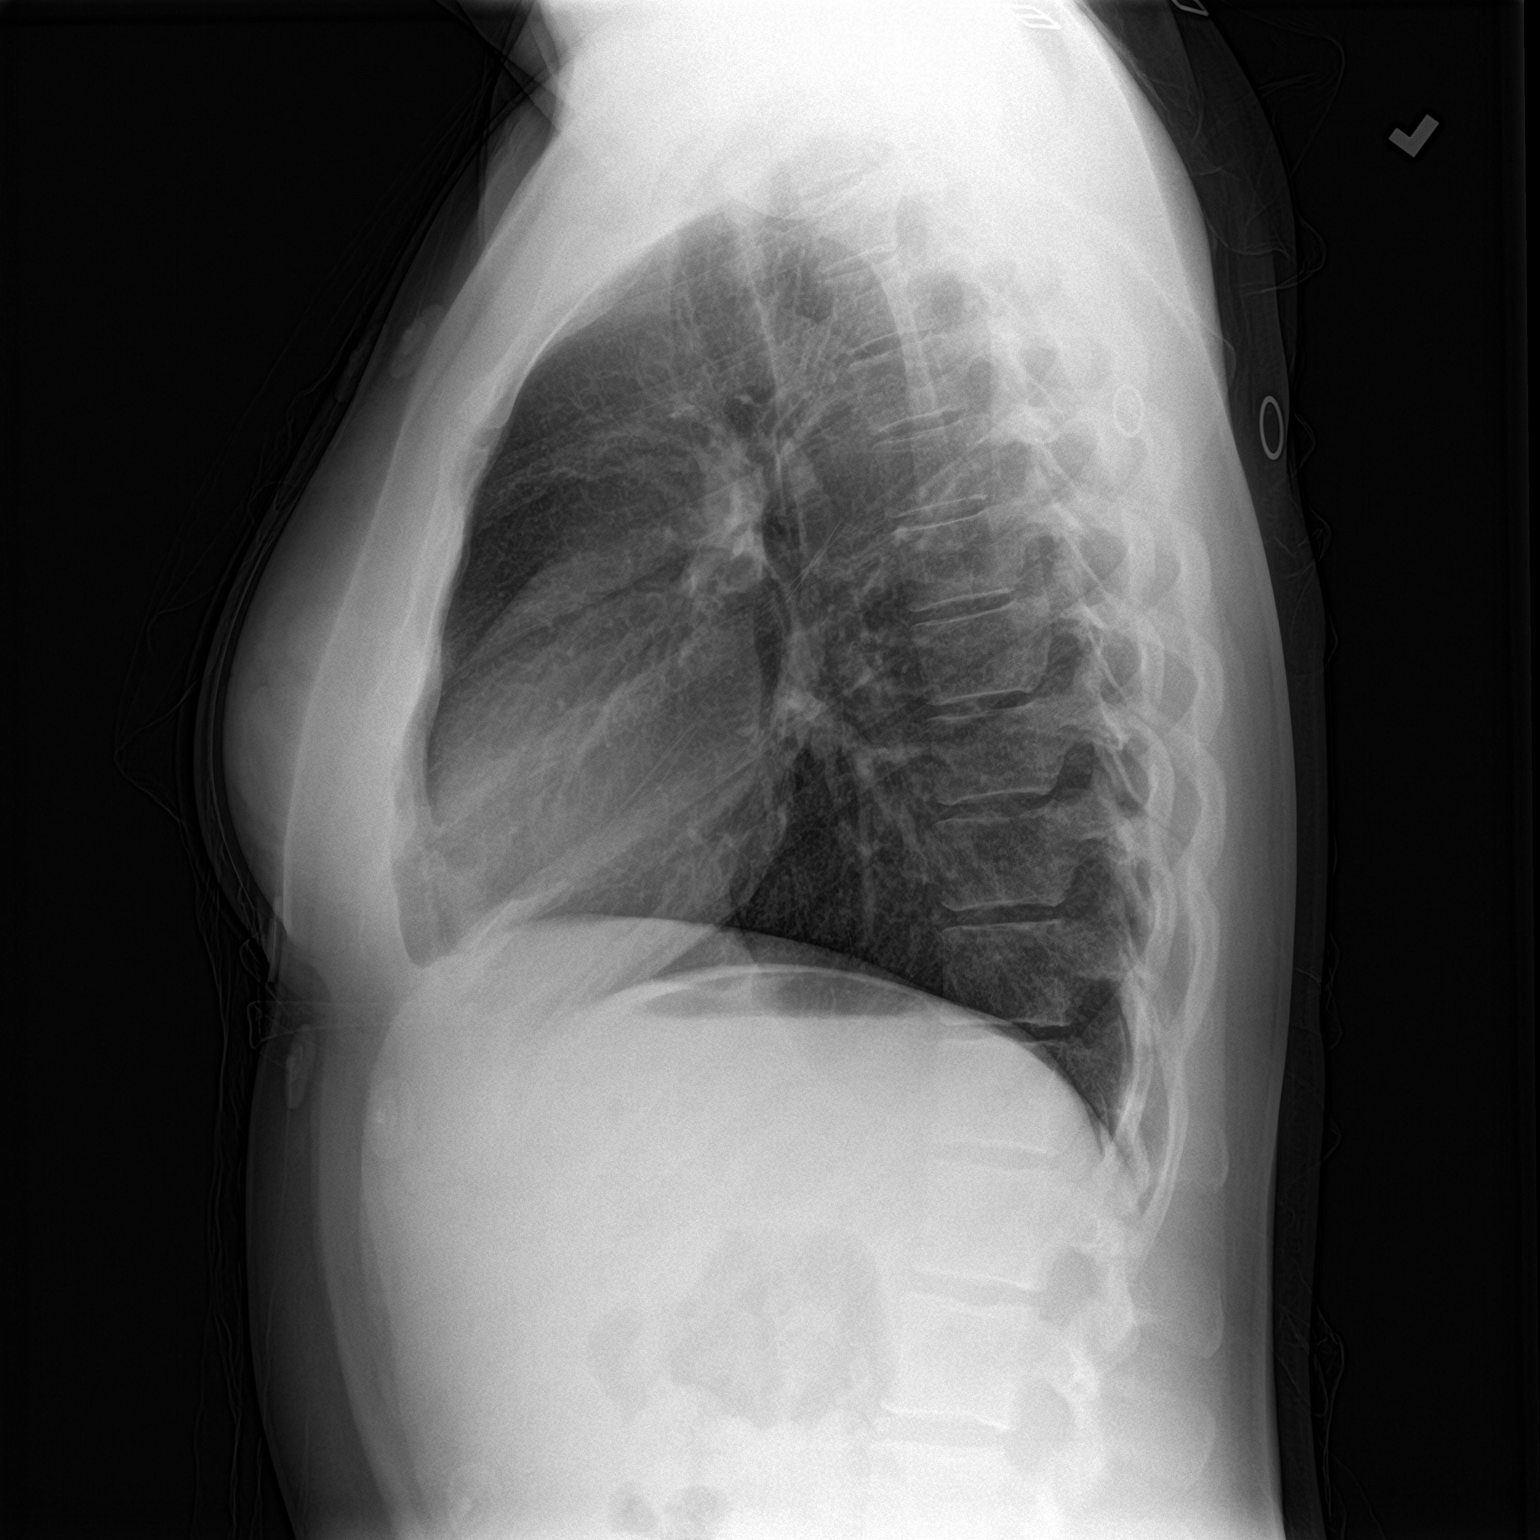

[2 of 2 positions shown; findings below may reference images not displayed]

FINDINGS: The cardiac and mediastinal silhouettes are within normal limits.

The lungs are normally inflated. No airspace consolidation, pleural
effusion, or pulmonary edema. No pneumothorax.

No acute osseous abnormality.
IMPRESSION: No active cardiopulmonary disease.

## 2021-07-09 IMAGING — RF DG CHOLANGIOGRAM OPERATIVE
1 series · 4 of 4 positions shown · non-contrast
Comparison: None.

CLINICAL DATA: 34-year-old female with cholelithiasis

EXAM:
INTRAOPERATIVE CHOLANGIOGRAM
TECHNIQUE: Cholangiographic images from the C-arm fluoroscopic device were
submitted for interpretation post-operatively. Please see the
procedural report for the amount of contrast and the fluoroscopy
time utilized.

[Series 1: run · 4 of 92 frames shown]
[frame 14/92]
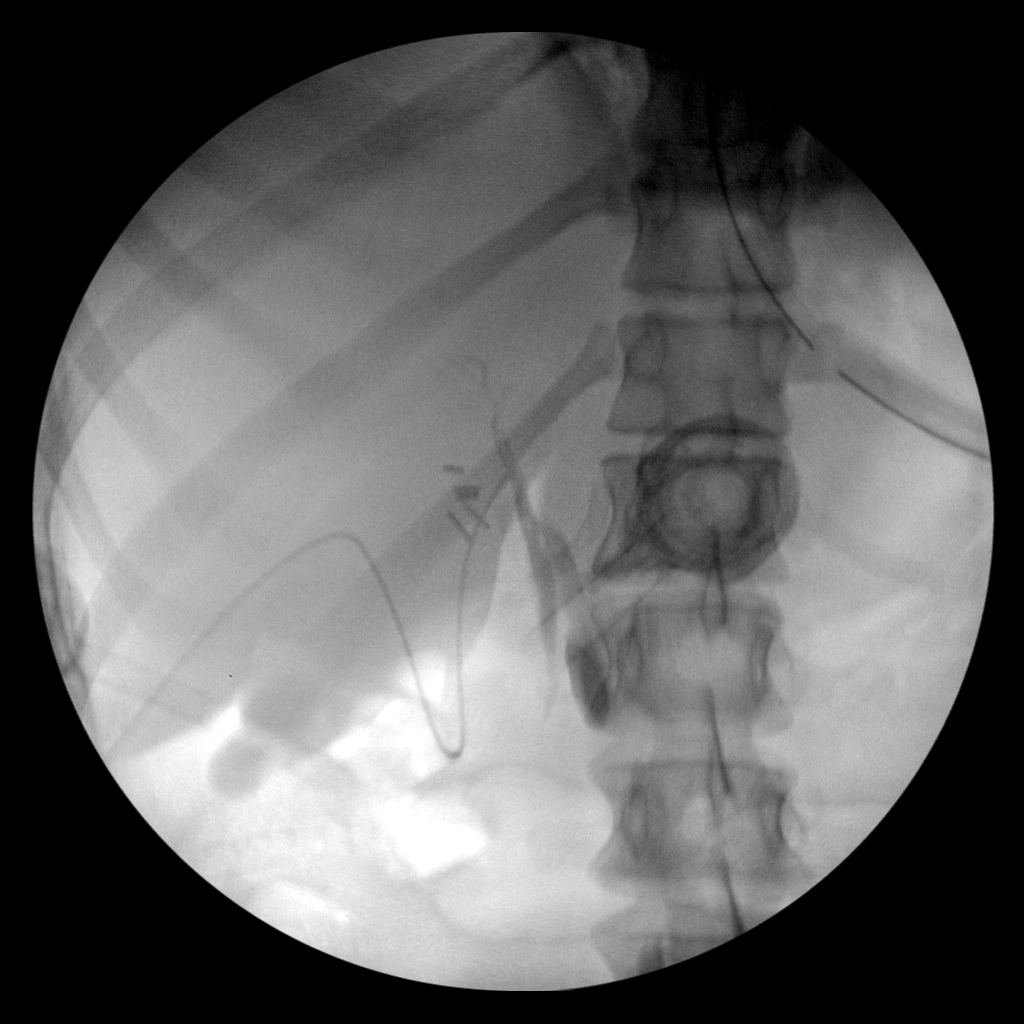
[frame 47/92]
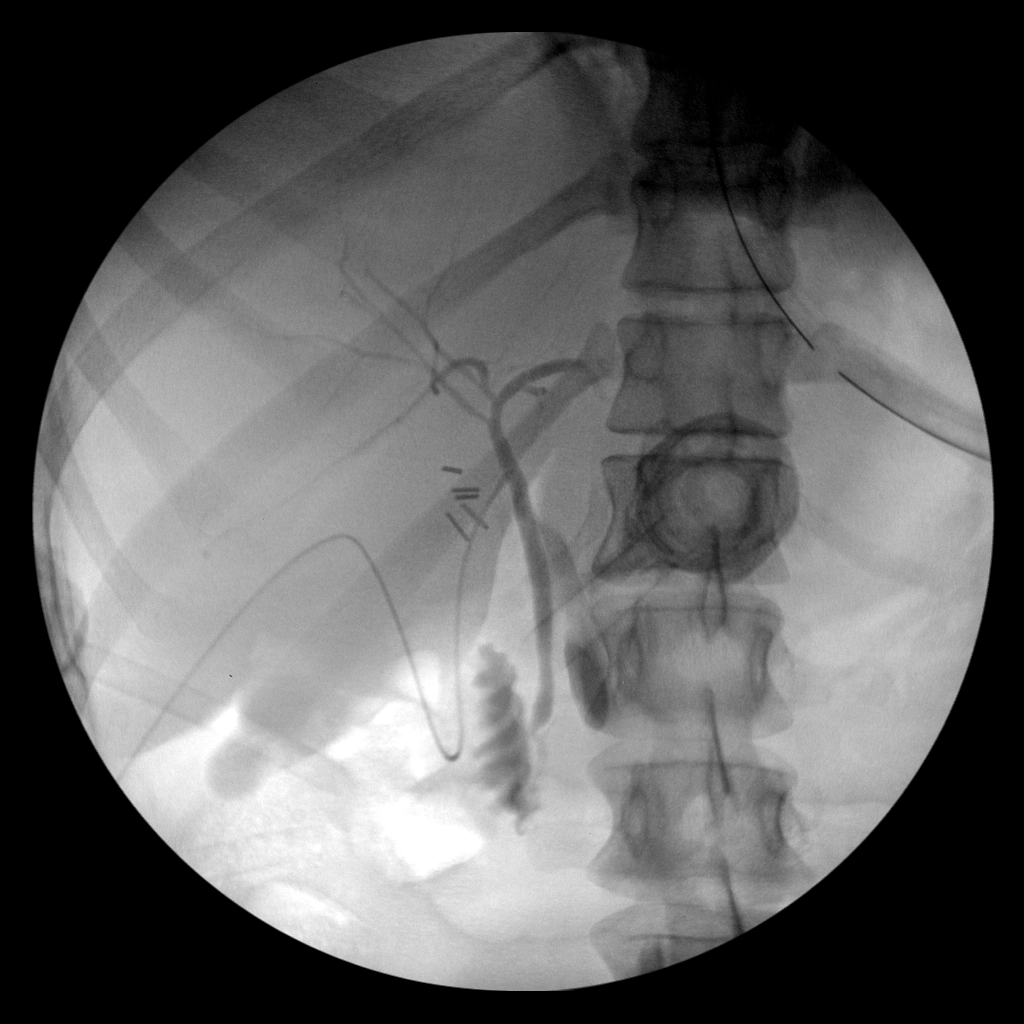
[frame 79/92]
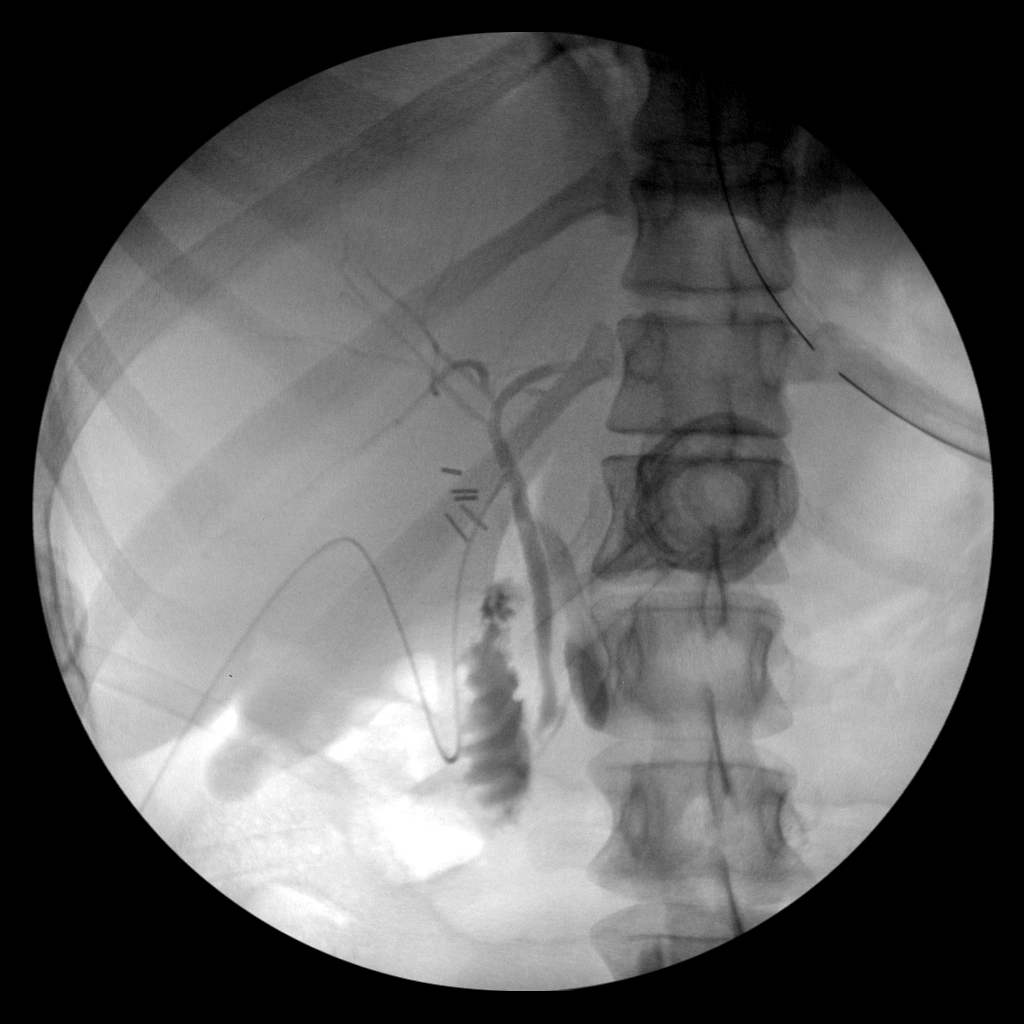
[frame 89/92]
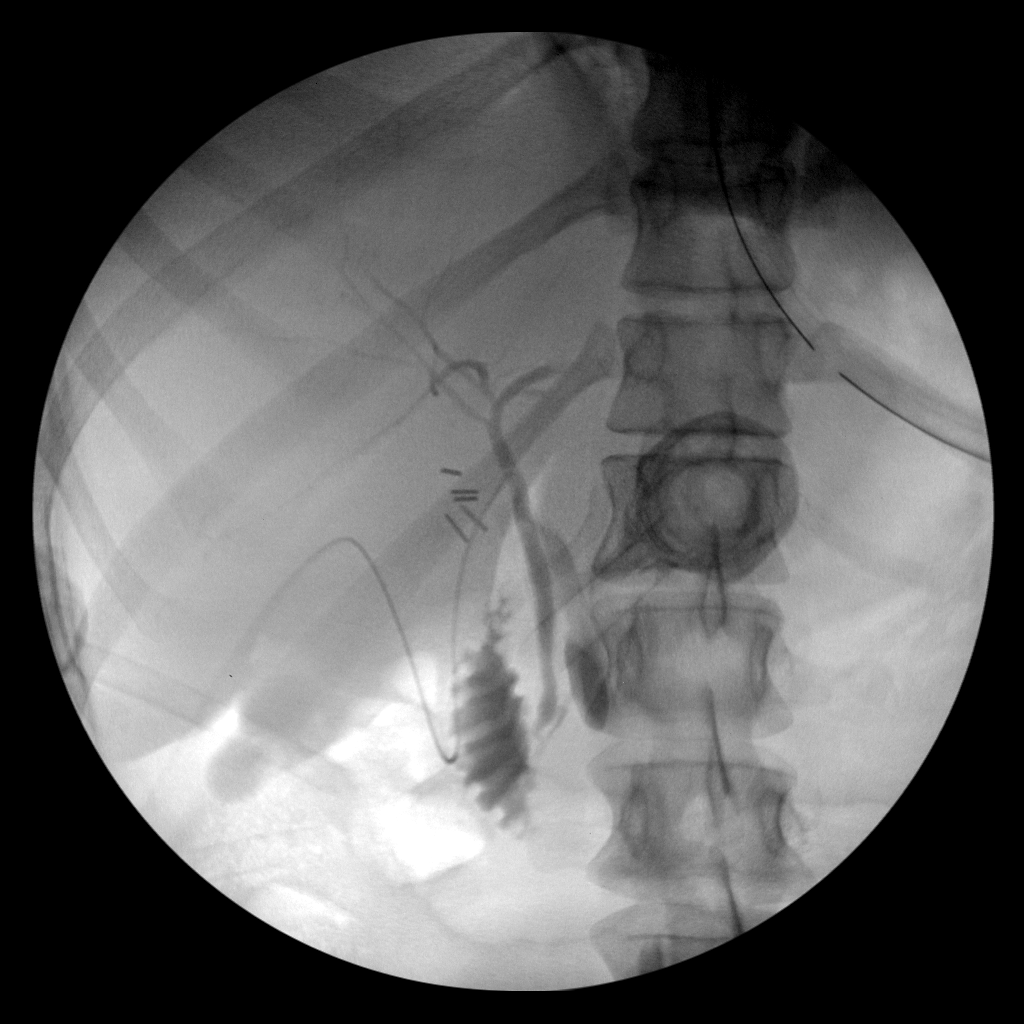

[4 of 4 positions shown; findings below may reference images not displayed]

FINDINGS: Surgical instruments project over the upper abdomen.

There is cannulation of the cystic duct/gallbladder neck, with
antegrade infusion of contrast. Caliber of the extrahepatic ductal
system within normal limits.

No definite filling defect within the extrahepatic ducts identified.

Free flow of contrast across the ampulla.
IMPRESSION: Intraoperative cholangiogram demonstrates extrahepatic biliary ducts
of unremarkable caliber, with no definite filling defects
identified. Free flow of contrast across the ampulla.

Please refer to the dictated operative report for full details of
intraoperative findings and procedure

## 2021-07-28 ENCOUNTER — Emergency Department (HOSPITAL_BASED_OUTPATIENT_CLINIC_OR_DEPARTMENT_OTHER)
Admission: EM | Admit: 2021-07-28 | Discharge: 2021-07-28 | Disposition: A | Discharge status: Home / Self Care | Payer: 59 | Attending: Emergency Medicine | Admitting: Emergency Medicine

## 2021-07-28 ENCOUNTER — Other Ambulatory Visit: Payer: Self-pay

## 2021-07-28 ENCOUNTER — Encounter (HOSPITAL_BASED_OUTPATIENT_CLINIC_OR_DEPARTMENT_OTHER): Payer: Self-pay

## 2021-07-28 DIAGNOSIS — W540XXA Bitten by dog, initial encounter: Secondary | ICD-10-CM | POA: Diagnosis not present

## 2021-07-28 DIAGNOSIS — S61451A Open bite of right hand, initial encounter: Secondary | ICD-10-CM | POA: Diagnosis present

## 2021-07-28 NOTE — ED Triage Notes (Signed)
Patient was bitten by dog belonging to her sister's next door neighbor on Friday night, has not been able to contact neighbor to find out about vaccination status, has not called animal control.  Bite is on right lateral hand, no redness or swelling.  Patient feels pretty certain animal is not vaccinated.

## 2021-07-28 NOTE — Discharge Instructions (Addendum)
Animal control has been notified and will follow up on the dog to check for vaccination status / the dog will be quarantined until rabies status can be determined. I do not think you need rabies prophylaxis at this time. The wound does not appear infected and I do not think you need antibiotics at this time. Please return if you start to notice signs of infection.

## 2021-07-28 NOTE — ED Provider Notes (Signed)
[facility] EMERGENCY DEPARTMENT Provider Note   CSN: [id number] Arrival date & time: 07/28/21  1530     History Chief Complaint  Patient presents with   Animal Bite    Yesenia Johnson is a 35 y.o. female reports she was bitten by a dog on her right hand 2 days ago.  Patient is unsure of the dog's vaccination status, has not called animal control.  Patient reports the wound is small, was cleaned thoroughly after the event.  Patient notices no redness, swelling, purulent drainage from wound.  Patient is able to move hand without difficulty, denies fever.   Animal Bite     Past Medical History:  Diagnosis Date   Depression     Patient Active Problem List   Diagnosis Date Noted   Anxiety 10/29/2020   Difficulty concentrating 10/29/2020   Sleep disturbances 10/29/2020   Acute cholecystitis 03/23/2020   Dysuria 06/14/2018   Abnormal urinalysis 06/14/2018   Counseling on health promotion and disease prevention 01/21/2018   Nutritional counseling 01/21/2018   Depression 06/16/2017   Overweight (BMI 25.0-29.9) 06/16/2017   Environmental and seasonal allergies 06/16/2017   Family history of diabetes mellitus in mother 06/16/2017   Family history of hypertension- mom 06/16/2017   Family history of breast cancer- in M aunt in her early 37's 06/16/2017   Pap smear of cervix shows high risk HPV present- in past, had colposcopy, 06/16/2017    Past Surgical History:  Procedure Laterality Date   CHOLECYSTECTOMY N/A 03/24/2020   Procedure: LAPAROSCOPIC CHOLECYSTECTOMY WITH INTRAOPERATIVE CHOLANGIOGRAM;  Surgeon: Darnell Level, MD;  Location: [facility];  Service: General;  Laterality: N/A;   COLPOSCOPY     WISDOM TOOTH EXTRACTION  11/2016     OB History   No obstetric history on file.     Family History  Problem Relation Age of Onset   Hypertension Mother    Diabetes Mother    Stroke Maternal Grandmother    Diabetes Paternal Grandmother    Breast cancer Maternal Aunt     Alcoholism Maternal Aunt    Diabetes Paternal Aunt     Social History   Tobacco Use   Smoking status: Never   Smokeless tobacco: Never  Vaping Use   Vaping Use: Never used  Substance Use Topics   Alcohol use: Yes    Alcohol/week: 2.0 standard drinks    Types: 2 Cans of beer per week    Comment: 3-4 drinks per week    Drug use: No    Home Medications Prior to Admission medications   Medication Sig Start Date End Date Taking? Authorizing Provider  albuterol (VENTOLIN HFA) 108 (90 Base) MCG/ACT inhaler Inhale 2 puffs into the lungs every 6 (six) hours as needed for wheezing or shortness of breath. 12/03/20   Kallie Locks, FNP  azithromycin (ZITHROMAX) 250 MG tablet Take 2 tablets on day 1, then 1 tablet on days 2-5. Patient not taking: Reported on 12/03/2020 11/26/20   Hetty Blend L, NP-C  benzonatate (TESSALON) 100 MG capsule Take 1 capsule (100 mg total) by mouth 2 (two) times daily as needed for cough. 12/03/20   Kallie Locks, FNP  dicyclomine (BENTYL) 20 MG tablet TAKE 1 TABLET (20 MG TOTAL) BY MOUTH 4 (FOUR) TIMES DAILY AS NEEDED FOR SPASMS. 07/11/20   Tressia Danas, MD  predniSONE (STERAPRED UNI-PAK 21 TAB) 10 MG (21) TBPK tablet Take by mouth daily. Tapering dose as recommended. Patient not taking: Reported on 12/03/2020 11/26/20  Henson, Vickie L, NP-C  venlafaxine XR (EFFEXOR-XR) 75 MG 24 hr capsule TAKE 1 CAPSULE(75 MG) BY MOUTH DAILY WITH BREAKFAST 12/24/20   Henson, Vickie L, NP-C    Allergies    Penicillins  Review of Systems   Review of Systems  Skin:  Positive for wound.  All other systems reviewed and are negative.  Physical Exam Updated Vital Signs BP (!) 142/87 (BP Location: Right Arm)   Pulse 84   Temp 98.2 F (36.8 C) (Oral)   Resp 16   Ht 5\' 6"  (1.676 m)   Wt 72.6 kg   SpO2 100%   BMI 25.82 kg/m   Physical Exam Vitals and nursing note reviewed.  Constitutional:      General: She is not in acute distress.    Appearance: Normal  appearance.  HENT:     Head: Normocephalic and atraumatic.  Eyes:     General:        Right eye: No discharge.        Left eye: No discharge.  Cardiovascular:     Rate and Rhythm: Normal rate and regular rhythm.  Pulmonary:     Effort: Pulmonary effort is normal. No respiratory distress.  Musculoskeletal:        General: No deformity.     Comments: 5 out of 5 strength of right hand and all fingers  Skin:    General: Skin is warm and dry.     Capillary Refill: Capillary refill takes less than 2 seconds. Cap refill intact distal to injury    Comments: Small excoriation, without laceration, without obvious puncture wound on the dorsal surface of right hand, close to her pinky.  No redness, swelling, purulent drainage.  Neurological:     Mental Status: She is alert and oriented to person, place, and time.     Comments: No sensory deficit of right hand distal or proximal to the injury  Psychiatric:        Mood and Affect: Mood normal.        Behavior: Behavior normal.    ED Results / Procedures / Treatments   Labs (all labs ordered are listed, but only abnormal results are displayed) Labs Reviewed - No data to display  EKG None  Radiology No results found.  Procedures Procedures   Medications Ordered in ED Medications - No data to display  ED Course  I have reviewed the triage vital signs and the nursing notes.  Pertinent labs & imaging results that were available during my care of the patient were reviewed by me and considered in my medical decision making (see chart for details).    MDM Rules/Calculators/A&P                         We discussed that we do not do postexposure prophylactic rabies vaccination if the animal involved can be located.  Discussed that a request to animal services was initiated once patient arrived for triage.  They will contact her in follow-up on the offending parties vaccination status.  Based on her description of a domestic dog that has  been stented all times, and is owned by someone in her residential neighborhood I have low concern for rabies at this time.  I have no concern for cellulitis based on patient presentation at this time, do not believe she requires antibiotics at this time.  Patient should return for further evaluation if she begins to develop redness, pain, swelling, purulent drainage  from the affected limb. Final Clinical Impression(s) / ED Diagnoses Final diagnoses:  Dog bite, initial encounter    Rx / DC Orders ED Discharge Orders     None        Olene Floss, PA-C 07/28/21 1639    Arby Barrette, MD 07/28/21 1709

## 2021-07-30 ENCOUNTER — Telehealth: Payer: Self-pay | Admitting: Family Medicine

## 2021-07-30 NOTE — Telephone Encounter (Signed)
Left message for pt concerning recent ER visit. Pt was advised if follow up was needed to please call the office.

## 2021-09-03 NOTE — Progress Notes (Signed)
Subjective:    Patient ID: Yesenia Johnson, female    DOB: [birth year], 35 y.o.   MRN: [id number]  HPI Chief Complaint  Patient presents with   Annual Exam    With pap smear   She is here for a complete physical exam.   Other providers: Dr. Tressia Danas- Gastroenterologist Psychiatrist   New diagnosis of Bipolar per patient.  Started on Lamictal by [facility]. Depression makes her not want to eat. Vegetarian 3 months ago.     Social history: Lives with husband, no kids works as a Engineer, civil (consulting)  Denies smoking or drug use. Alcohol 1-2 drinks per week.  Diet: Recently "complicated" Excerise: walking dogs more   Immunizations: Tdap in the past 10 years   Health maintenance:   Mammogram: N/A Colonoscopy: N/A Last Gynecological Exam: Last Menstrual cycle: 08/05/2021 and irregular  Husband vasectomy  Last Dental Exam: years ago  Last Eye Exam: UTD   Wears seatbelt always, uses sunscreen, smoke detectors in home and functioning, does not text while driving and feels safe in home environment.   Reviewed allergies, medications, past medical, surgical, family, and social history.   Review of Systems Review of Systems Constitutional: -fever, -chills, -sweats, -unexpected weight change,-fatigue ENT: -runny nose, -ear pain, -sore throat Cardiology:  -chest pain, -palpitations, -edema Respiratory: -cough, -shortness of breath, -wheezing Gastroenterology: -abdominal pain, -nausea, -vomiting, -diarrhea, -constipation  Hematology: -bleeding or bruising problems Musculoskeletal: -arthralgias, -myalgias, -joint swelling, -back pain Ophthalmology: -vision changes Urology: -dysuria, -difficulty urinating, -hematuria, -urinary frequency, -urgency Neurology: -headache, -weakness, -tingling, -numbness       Objective:   Physical Exam BP 138/80 (BP Location: Right Arm, Patient Position: Sitting)   Pulse 80   Ht 5\' 6"  (1.676 m)   Wt 174 lb (78.9 kg)    LMP 08/03/2021   SpO2 98%   BMI 28.08 kg/m   General Appearance:    Alert, cooperative, no distress, appears stated age  Head:    Normocephalic, without obvious abnormality, atraumatic  Eyes:    PERRL, conjunctiva/corneas clear, EOM's intact  Ears:    Normal TM's and external ear canals  Nose: Mask on  Throat: Mask on  Neck:   Supple, no lymphadenopathy;  thyroid:  no   enlargement/tenderness/nodules; no JVD  Back:    Spine nontender, no curvature, ROM normal, no CVA     tenderness  Lungs:     Clear to auscultation bilaterally without wheezes, rales or     ronchi; respirations unlabored  Chest Wall:    No tenderness or deformity   Heart:    Regular rate and rhythm, S1 and S2 normal, no murmur, rub   or gallop  Breast Exam:    No tenderness, masses, or nipple discharge or inversion.      No axillary lymphadenopathy  Abdomen:     Soft, non-tender, nondistended, normoactive bowel sounds,    no masses, no hepatosplenomegaly  Genitalia:    Normal external genitalia without lesions.  BUS and vagina normal; cervix without lesions, or cervical motion tenderness. No abnormal vaginal discharge.  Uterus and adnexa not enlarged, nontender, no masses.  Pap performed.  Chaperone present  Rectal:    Not performed due to age<40 and no related complaints  Extremities:   No clubbing, cyanosis or edema  Pulses:   2+ and symmetric all extremities  Skin:   Skin color, texture, turgor normal, no rashes or lesions  Lymph nodes:   Cervical, supraclavicular, and axillary nodes normal  Neurologic:   CNII-XII intact, normal strength, sensation and gait          Psych:   Normal mood, affect, hygiene and grooming.        Assessment & Plan:  Routine general medical examination at a health care facility - Plan: CBC with Differential/Platelet, Comprehensive metabolic panel, TSH, T4, free, Lipid panel  Screening for cervical cancer - Plan: Cytology - PAP([facility])  History of HPV infection - Plan: Cytology -  PAP([facility])  Depression, unspecified depression type  Need for hepatitis C screening test - Plan: Hepatitis C antibody  Elevated LDL cholesterol level - Plan: Lipid panel  Screening for thyroid disorder - Plan: TSH, T4, free  Preventive health care reviewed.  Pap smear updated.  Chaperone present.  History of abnormal Pap with colposcopy in the past.  She is trying to switch to vegetarian diet and is looking for healthy ways to do this.  Encouraged her to increase physical activity.  She is under the care of psychiatrist for her depression. Hep C screening done per guidelines.  Recommend regular dental and eye exams.  Discussed safety and health promotion. She is aware that if her Pap smear is abnormal that she will be referred back to gynecology since she has a history of HPV.

## 2021-09-03 NOTE — Patient Instructions (Addendum)
I recommend scheduling a dental exam.   Preventive Care 35-35 Years Old, Female Preventive care refers to lifestyle choices and visits with your health care provider that can promote health and wellness. This includes: A yearly physical exam. This is also called an annual wellness visit. Regular dental and eye exams. Immunizations. Screening for certain conditions. Healthy lifestyle choices, such as: Eating a healthy diet. Getting regular exercise. Not using drugs or products that contain nicotine and tobacco. Limiting alcohol use. What can I expect for my preventive care visit? Physical exam Your health care provider may check your: Height and weight. These may be used to calculate your BMI (body mass index). BMI is a measurement that tells if you are at a healthy weight. Heart rate and blood pressure. Body temperature. Skin for abnormal spots. Counseling Your health care provider may ask you questions about your: Past medical problems. Family's medical history. Alcohol, tobacco, and drug use. Emotional well-being. Home life and relationship well-being. Sexual activity. Diet, exercise, and sleep habits. Work and work Statistician. Access to firearms. Method of birth control. Menstrual cycle. Pregnancy history. What immunizations do I need? Vaccines are usually given at various ages, according to a schedule. Your health care provider will recommend vaccines for you based on your age, medical history, and lifestyle or other factors, such as travel or where you work. What tests do I need? Blood tests Lipid and cholesterol levels. These may be checked every 5 years starting at age 35. Hepatitis C test. Hepatitis B test. Screening Diabetes screening. This is done by checking your blood sugar (glucose) after you have not eaten for a while (fasting). STD (sexually transmitted disease) testing, if you are at risk. BRCA-related cancer screening. This may be done if you have a  family history of breast, ovarian, tubal, or peritoneal cancers. Pelvic exam and Pap test. This may be done every 3 years starting at age 35. Starting at age 35, this may be done every 5 years if you have a Pap test in combination with an HPV test. Talk with your health care provider about your test results, treatment options, and if necessary, the need for more tests. Follow these instructions at home: Eating and drinking  Eat a healthy diet that includes fresh fruits and vegetables, whole grains, lean protein, and low-fat dairy products. Take vitamin and mineral supplements as recommended by your health care provider. Do not drink alcohol if: Your health care provider tells you not to drink. You are pregnant, may be pregnant, or are planning to become pregnant. If you drink alcohol: Limit how much you have to 0-1 drink a day. Be aware of how much alcohol is in your drink. In the [place] one drink equals one 12 oz bottle of beer (355 mL), one 5 oz glass of wine (148 mL), or one 1 oz glass of hard liquor (44 mL). Lifestyle Take daily care of your teeth and gums. Brush your teeth every morning and night with fluoride toothpaste. Floss one time each day. Stay active. Exercise for at least 30 minutes 5 or more days each week. Do not use any products that contain nicotine or tobacco, such as cigarettes, e-cigarettes, and chewing tobacco. If you need help quitting, ask your health care provider. Do not use drugs. If you are sexually active, practice safe sex. Use a condom or other form of protection to prevent STIs (sexually transmitted infections). If you do not wish to become pregnant, use a form of birth control. If you plan  to become pregnant, see your health care provider for a prepregnancy visit. Find healthy ways to cope with stress, such as: Meditation, yoga, or listening to music. Journaling. Talking to a trusted person. Spending time with friends and family. Safety Always wear your  seat belt while driving or riding in a vehicle. Do not drive: If you have been drinking alcohol. Do not ride with someone who has been drinking. When you are tired or distracted. While texting. Wear a helmet and other protective equipment during sports activities. If you have firearms in your house, make sure you follow all gun safety procedures. Seek help if you have been physically or sexually abused. What's next? Go to your health care provider once a year for an annual wellness visit. Ask your health care provider how often you should have your eyes and teeth checked. Stay up to date on all vaccines. This information is not intended to replace advice given to you by your health care provider. Make sure you discuss any questions you have with your health care provider. Document Revised: 01/18/2021 Document Reviewed: 07/22/2018 Elsevier Patient Education  2022 [employer].

## 2021-09-04 ENCOUNTER — Other Ambulatory Visit (HOSPITAL_COMMUNITY)
Admission: RE | Admit: 2021-09-04 | Discharge: 2021-09-04 | Disposition: A | Discharge status: Home / Self Care | Payer: 59 | Source: Ambulatory Visit | Attending: Family Medicine | Admitting: Family Medicine

## 2021-09-04 ENCOUNTER — Other Ambulatory Visit: Payer: Self-pay

## 2021-09-04 ENCOUNTER — Encounter: Payer: Self-pay | Admitting: Family Medicine

## 2021-09-04 ENCOUNTER — Ambulatory Visit (INDEPENDENT_AMBULATORY_CARE_PROVIDER_SITE_OTHER): Payer: 59 | Admitting: Family Medicine

## 2021-09-04 VITALS — BP 138/80 | HR 80 | Ht 66.0 in | Wt 174.0 lb

## 2021-09-04 DIAGNOSIS — Z1159 Encounter for screening for other viral diseases: Secondary | ICD-10-CM

## 2021-09-04 DIAGNOSIS — Z23 Encounter for immunization: Secondary | ICD-10-CM

## 2021-09-04 DIAGNOSIS — F32A Depression, unspecified: Secondary | ICD-10-CM

## 2021-09-04 DIAGNOSIS — Z1329 Encounter for screening for other suspected endocrine disorder: Secondary | ICD-10-CM

## 2021-09-04 DIAGNOSIS — Z124 Encounter for screening for malignant neoplasm of cervix: Secondary | ICD-10-CM

## 2021-09-04 DIAGNOSIS — Z0000 Encounter for general adult medical examination without abnormal findings: Secondary | ICD-10-CM | POA: Diagnosis not present

## 2021-09-04 DIAGNOSIS — Z8619 Personal history of other infectious and parasitic diseases: Secondary | ICD-10-CM

## 2021-09-04 DIAGNOSIS — E7800 Pure hypercholesterolemia, unspecified: Secondary | ICD-10-CM

## 2021-09-04 NOTE — Addendum Note (Signed)
Addended by: Fulton Reek A on: 09/04/2021 12:46 PM   Modules accepted: Orders

## 2021-09-05 LAB — COMPREHENSIVE METABOLIC PANEL
ALT: 20 IU/L (ref 0–32)
AST: 19 IU/L (ref 0–40)
Albumin/Globulin Ratio: 1.9 (ref 1.2–2.2)
Albumin: 4.8 g/dL (ref 3.8–4.8)
Alkaline Phosphatase: 76 IU/L (ref 44–121)
BUN/Creatinine Ratio: 11 (ref 9–23)
BUN: 9 mg/dL (ref 6–20)
Bilirubin Total: 0.7 mg/dL (ref 0.0–1.2)
CO2: 21 mmol/L (ref 20–29)
Calcium: 9.6 mg/dL (ref 8.7–10.2)
Chloride: 101 mmol/L (ref 96–106)
Creatinine, Ser: 0.83 mg/dL (ref 0.57–1.00)
Globulin, Total: 2.5 g/dL (ref 1.5–4.5)
Glucose: 92 mg/dL (ref 70–99)
Potassium: 4.6 mmol/L (ref 3.5–5.2)
Sodium: 137 mmol/L (ref 134–144)
Total Protein: 7.3 g/dL (ref 6.0–8.5)
eGFR: 94 mL/min/{1.73_m2} (ref >59)

## 2021-09-05 LAB — LIPID PANEL
Chol/HDL Ratio: 3.1 ratio (ref 0.0–4.4)
Cholesterol, Total: 212 mg/dL — ABNORMAL HIGH (ref 100–199)
HDL: 68 mg/dL (ref >39)
LDL Chol Calc (NIH): 128 mg/dL — ABNORMAL HIGH (ref 0–99)
Triglycerides: 91 mg/dL (ref 0–149)
VLDL Cholesterol Cal: 16 mg/dL (ref 5–40)

## 2021-09-05 LAB — CBC WITH DIFFERENTIAL/PLATELET
Basophils Absolute: 0.1 10*3/uL (ref 0.0–0.2)
Basos: 1 %
EOS (ABSOLUTE): 0.3 10*3/uL (ref 0.0–0.4)
Eos: 3 %
Hematocrit: 41.8 % (ref 34.0–46.6)
Hemoglobin: 14.5 g/dL (ref 11.1–15.9)
Immature Grans (Abs): 0 10*3/uL (ref 0.0–0.1)
Immature Granulocytes: 0 %
Lymphocytes Absolute: 2.6 10*3/uL (ref 0.7–3.1)
Lymphs: 35 %
MCH: 31.2 pg (ref 26.6–33.0)
MCHC: 34.7 g/dL (ref 31.5–35.7)
MCV: 90 fL (ref 79–97)
Monocytes Absolute: 0.5 10*3/uL (ref 0.1–0.9)
Monocytes: 7 %
Neutrophils Absolute: 4 10*3/uL (ref 1.4–7.0)
Neutrophils: 54 %
Platelets: 401 10*3/uL (ref 150–450)
RBC: 4.65 x10E6/uL (ref 3.77–5.28)
RDW: 11.6 % — ABNORMAL LOW (ref 11.7–15.4)
WBC: 7.4 10*3/uL (ref 3.4–10.8)

## 2021-09-05 LAB — HEPATITIS C ANTIBODY: Hep C Virus Ab: <0.1 s/co ratio (ref 0.0–0.9)

## 2021-09-05 LAB — T4, FREE: Free T4: 1.15 ng/dL (ref 0.82–1.77)

## 2021-09-05 LAB — TSH: TSH: 4.1 u[IU]/mL (ref 0.450–4.500)

## 2021-09-06 LAB — CYTOLOGY - PAP
Comment: NEGATIVE
Diagnosis: NEGATIVE
High risk HPV: NEGATIVE

## 2021-10-19 ENCOUNTER — Telehealth: Payer: 59 | Admitting: Emergency Medicine

## 2021-10-19 DIAGNOSIS — R6889 Other general symptoms and signs: Secondary | ICD-10-CM

## 2021-10-19 DIAGNOSIS — Z20828 Contact with and (suspected) exposure to other viral communicable diseases: Secondary | ICD-10-CM

## 2021-10-19 MED ORDER — OSELTAMIVIR PHOSPHATE 75 MG PO CAPS: 75.0000 mg | ORAL_CAPSULE | Freq: Two times a day (BID) | ORAL | 0 refills | Status: AC

## 2021-10-19 NOTE — Progress Notes (Signed)
Virtual Visit Consent   Yesenia Johnson, you are scheduled for a virtual visit with a [facility] provider today.     Just as with appointments in the office, your consent must be obtained to participate.  Your consent will be active for this visit and any virtual visit you may have with one of our providers in the next 365 days.     If you have a MyChart account, a copy of this consent can be sent to you electronically.  All virtual visits are billed to your insurance company just like a traditional visit in the office.    As this is a virtual visit, video technology does not allow for your provider to perform a traditional examination.  This may limit your provider's ability to fully assess your condition.  If your provider identifies any concerns that need to be evaluated in person or the need to arrange testing (such as labs, EKG, etc.), we will make arrangements to do so.     Although advances in technology are sophisticated, we cannot ensure that it will always work on either your end or our end.  If the connection with a video visit is poor, the visit may have to be switched to a telephone visit.  With either a video or telephone visit, we are not always able to ensure that we have a secure connection.     I need to obtain your verbal consent now.   Are you willing to proceed with your visit today? Yes   Yesenia Johnson has provided verbal consent on 10/19/2021 for a virtual visit (video or telephone).   Rennis Harding, [place]   Date: 10/19/2021 4:35 PM   Virtual Visit via Video Note   I, Rennis Harding, connected with  Yesenia Johnson  ([id number], 1986/01/25) on 10/19/21 at  4:30 PM EST by a video-enabled telemedicine application and verified that I am speaking with the correct person using two identifiers.  Location: Patient: Virtual Visit Location Patient: Home Provider: Virtual Visit Location Provider: Home Office   I discussed the limitations of evaluation and management by  telemedicine and the availability of in person appointments. The patient expressed understanding and agreed to proceed.    History of Present Illness:Yesenia Johnson is a 35 y.o. who identifies as a female who was assigned female at birth, and is being seen today for sore throat, cough, fever, and body aches x 1 day.  Admits to flu exposure. Has tried OTC medications without relief.  Denies aggravating factors.  Reports previous symptoms in the past.   Denies rhinorrhea, SOB, wheezing, chest pain, nausea, vomiting, changes in bowel or bladder habits.    ROS: As per HPI.  All other pertinent ROS negative.    HPI: HPI  Problems:  Patient Active Problem List   Diagnosis Date Noted   Anxiety 10/29/2020   Difficulty concentrating 10/29/2020   Sleep disturbances 10/29/2020   Acute cholecystitis 03/23/2020   Dysuria 06/14/2018   Abnormal urinalysis 06/14/2018   Counseling on health promotion and disease prevention 01/21/2018   Nutritional counseling 01/21/2018   Depression 06/16/2017   Overweight (BMI 25.0-29.9) 06/16/2017   Environmental and seasonal allergies 06/16/2017   Family history of diabetes mellitus in mother 06/16/2017   Family history of hypertension- mom 06/16/2017   Family history of breast cancer- in M aunt in her early 24's 06/16/2017   Pap smear of cervix shows high risk HPV present- in past, had colposcopy, 06/16/2017    Allergies:  Allergies  Allergen Reactions   Penicillins Hives    Did it involve swelling of the face/tongue/throat, SOB, or low BP? Y Did it involve sudden or severe rash/hives, skin peeling, or any reaction on the inside of your mouth or nose? Y Did you need to seek medical attention at a hospital or doctor's office? Y When did it last happen? infancy      If all above answers are "NO", may proceed with cephalosporin use.    Medications:  Current Outpatient Medications:    oseltamivir (TAMIFLU) 75 MG capsule, Take 1 capsule (75 mg total) by mouth 2  (two) times daily for 5 days., Disp: 10 capsule, Rfl: 0   lamoTRIgine (LAMICTAL) 150 MG tablet, Take 150 mg by mouth daily., Disp: , Rfl:    venlafaxine XR (EFFEXOR-XR) 150 MG 24 hr capsule, Take 150 mg by mouth every morning., Disp: , Rfl:   Observations/Objective: Patient is well-developed, well-nourished in no acute distress.  Resting comfortably at home. Fatigued appearing, but nontoxic Head is normocephalic, atraumatic.  No labored breathing. Speaking in full sentences and tolerating own secretions Speech is clear and coherent with logical content.  Patient is alert and oriented at baseline.   Assessment and Plan: 1. Flu-like symptoms  2. Exposure to the flu  Get plenty of rest and push fluids Tamiflu prescribed for probable flu infection Use zyrtec for nasal congestion, runny nose, and/or sore throat Use flonase for nasal congestion and runny nose Use medications daily for symptom relief Use OTC medications like ibuprofen or tylenol as needed fever or pain Follow up with PCP, urgent care, or go to the ED if you have any new or worsening symptoms such as fever, worsening cough, shortness of breath, chest tightness, chest pain, turning blue, changes in mental status, etc...    Follow Up Instructions: I discussed the assessment and treatment plan with the patient. The patient was provided an opportunity to ask questions and all were answered. The patient agreed with the plan and demonstrated an understanding of the instructions.  A copy of instructions were sent to the patient via MyChart unless otherwise noted below.    The patient was advised to call back or seek an in-person evaluation if the symptoms worsen or if the condition fails to improve as anticipated.  Time:  I spent 5-10 minutes with the patient via telehealth technology discussing the above problems/concerns.    Rennis Harding, PA-C

## 2021-10-19 NOTE — Patient Instructions (Signed)
  Shanda Bumps, thank you for joining Rennis Harding, PA-C for today's virtual visit.  While this provider is not your primary care provider (PCP), if your PCP is located in our provider database this encounter information will be shared with them immediately following your visit.  Consent: (Patient) Yesenia Johnson provided verbal consent for this virtual visit at the beginning of the encounter.  Current Medications:  Current Outpatient Medications:    oseltamivir (TAMIFLU) 75 MG capsule, Take 1 capsule (75 mg total) by mouth 2 (two) times daily for 5 days., Disp: 10 capsule, Rfl: 0   lamoTRIgine (LAMICTAL) 150 MG tablet, Take 150 mg by mouth daily., Disp: , Rfl:    venlafaxine XR (EFFEXOR-XR) 150 MG 24 hr capsule, Take 150 mg by mouth every morning., Disp: , Rfl:    Medications ordered in this encounter:  Meds ordered this encounter  Medications   oseltamivir (TAMIFLU) 75 MG capsule    Sig: Take 1 capsule (75 mg total) by mouth 2 (two) times daily for 5 days.    Dispense:  10 capsule    Refill:  0    Order Specific Question:   Supervising Provider    Answer:   Hyacinth Meeker, BRIAN [[id number]]     *If you need refills on other medications prior to your next appointment, please contact your pharmacy*  Follow-Up: Call back or seek an in-person evaluation if the symptoms worsen or if the condition fails to improve as anticipated.  Other Instructions Get plenty of rest and push fluids Tamiflu prescribed for probable flu infection Use zyrtec for nasal congestion, runny nose, and/or sore throat Use flonase for nasal congestion and runny nose Use medications daily for symptom relief Use OTC medications like ibuprofen or tylenol as needed fever or pain Follow up with PCP, urgent care, or go to the ED if you have any new or worsening symptoms such as fever, worsening cough, shortness of breath, chest tightness, chest pain, turning blue, changes in mental status, etc...     If you have been  instructed to have an in-person evaluation today at a local Urgent Care facility, please use the link below. It will take you to a list of all of our available [facility], including address, phone number and hours of operation. Please do not delay care.  [facility]  If you or a family member do not have a primary care provider, use the link below to schedule a visit and establish care. When you choose a [facility] primary care physician or advanced practice provider, you gain a long-term partner in health. Find a Primary Care Provider  Learn more about [facility]'s in-office and virtual care options: [facility] - Get Care Now

## 2022-01-14 ENCOUNTER — Ambulatory Visit: Payer: 59 | Admitting: Physician Assistant

## 2022-01-14 ENCOUNTER — Encounter: Payer: Self-pay | Admitting: Physician Assistant

## 2022-01-14 ENCOUNTER — Other Ambulatory Visit: Payer: Self-pay

## 2022-01-14 VITALS — BP 136/84 | HR 80 | Resp 18 | Ht 66.0 in | Wt 171.2 lb

## 2022-01-14 DIAGNOSIS — E559 Vitamin D deficiency, unspecified: Secondary | ICD-10-CM | POA: Diagnosis not present

## 2022-01-14 DIAGNOSIS — G4489 Other headache syndrome: Secondary | ICD-10-CM

## 2022-01-14 DIAGNOSIS — J3089 Other allergic rhinitis: Secondary | ICD-10-CM

## 2022-01-14 NOTE — Progress Notes (Signed)
Acute Office Visit  Subjective:    Patient ID: Yesenia Johnson, female    DOB: [birth year], 36 y.o.   MRN: [id number]  Chief Complaint  Patient presents with   Headache    Since last Monday has been having on and off headaches. Different things worked different times. Tylenol, ibuprofen and another OTC NSAID for back pain. (Doan's)     HPI  Patient is in today for a follow up appointment.  Reports having intermittent headaches for 1 week; feels pressure all over head, in frontal area as well as temples; ibuprofen, Tylenol, and Doan's pills are helpful; headaches start in the afternoon and last for hours; sometimes light and sound make headaches worse; sometimes has nausea, but no vomiting; denies exposure to new irritants; states in general she rarely gets headaches; LMP was last week; reports eating and drinking plenty of fluids; stopped wearing headbands to make sure that wasn't the cause of HA; denies runny nose or sneezing, but does have post-nasal drip and a history of environmental and seasonal allergies; Mom has HTN.  Past Medical History:  Diagnosis Date   Depression     Past Surgical History:  Procedure Laterality Date   CHOLECYSTECTOMY N/A 03/24/2020   Procedure: LAPAROSCOPIC CHOLECYSTECTOMY WITH INTRAOPERATIVE CHOLANGIOGRAM;  Surgeon: Armandina Gemma, MD;  Location: [facility];  Service: General;  Laterality: N/A;   COLPOSCOPY     WISDOM TOOTH EXTRACTION  11/2016    Family History  Problem Relation Age of Onset   Hypertension Mother    Diabetes Mother    Stroke Maternal Grandmother    Diabetes Paternal Grandmother    Breast cancer Maternal Aunt    Alcoholism Maternal Aunt    Diabetes Paternal Aunt     Social History   Socioeconomic History   Marital status: Married    Spouse name: Not on file   Number of children: Not on file   Years of education: Not on file   Highest education level: Not on file  Occupational History   Not on file  Tobacco Use   Smoking  status: Never   Smokeless tobacco: Never  Vaping Use   Vaping Use: Never used  Substance and Sexual Activity   Alcohol use: Yes    Alcohol/week: 2.0 standard drinks    Types: 2 Cans of beer per week    Comment: 3-4 drinks per week    Drug use: No   Sexual activity: Yes    Partners: Male    Birth control/protection: None  Other Topics Concern   Not on file  Social History Narrative   Not on file   Social Determinants of Health   Financial Resource Strain: Not on file  Food Insecurity: Not on file  Transportation Needs: Not on file  Physical Activity: Not on file  Stress: Not on file  Social Connections: Not on file  Intimate Partner Violence: Not on file    Outpatient Medications Prior to Visit  Medication Sig Dispense Refill   acetaminophen (TYLENOL) 500 MG tablet Take 500 mg by mouth every 6 (six) hours as needed.     lamoTRIgine (LAMICTAL) 150 MG tablet Take 150 mg by mouth daily.     venlafaxine XR (EFFEXOR-XR) 150 MG 24 hr capsule Take 150 mg by mouth every morning.     No facility-administered medications prior to visit.    Allergies  Allergen Reactions   Penicillins Hives    Did it involve swelling of the face/tongue/throat, SOB, or low BP? Darreld Mclean  Did it involve sudden or severe rash/hives, skin peeling, or any reaction on the inside of your mouth or nose? Y Did you need to seek medical attention at a hospital or doctor's office? Y When did it last happen? infancy      If all above answers are NO, may proceed with cephalosporin use.     Review of Systems  Constitutional:  Negative for activity change and chills.  HENT:  Positive for postnasal drip. Negative for congestion, nosebleeds, sneezing and voice change.   Eyes:  Negative for pain and redness.  Respiratory:  Negative for cough and wheezing.   Cardiovascular:  Negative for chest pain.  Gastrointestinal:  Negative for constipation, diarrhea, nausea and vomiting.  Endocrine: Negative for polyuria.   Genitourinary:  Negative for frequency.  Skin:  Negative for color change and rash.  Allergic/Immunologic: Negative for immunocompromised state.  Neurological:  Positive for headaches. Negative for dizziness.  Psychiatric/Behavioral:  Negative for agitation.       Objective:    Physical Exam Vitals and nursing note reviewed.  Constitutional:      General: She is not in acute distress.    Appearance: Normal appearance. She is not ill-appearing.  HENT:     Head: Normocephalic and atraumatic.     Right Ear: External ear normal.     Left Ear: External ear normal.     Nose: No congestion.  Eyes:     Extraocular Movements: Extraocular movements intact.     Conjunctiva/sclera: Conjunctivae normal.     Pupils: Pupils are equal, round, and reactive to light.  Cardiovascular:     Rate and Rhythm: Normal rate and regular rhythm.     Pulses: Normal pulses.     Heart sounds: Normal heart sounds.  Pulmonary:     Effort: Pulmonary effort is normal.     Breath sounds: Normal breath sounds. No wheezing.  Abdominal:     General: Bowel sounds are normal.     Palpations: Abdomen is soft.  Musculoskeletal:     Cervical back: Normal range of motion and neck supple.     Right lower leg: No edema.     Left lower leg: No edema.  Skin:    General: Skin is warm and dry.     Findings: No bruising.  Neurological:     General: No focal deficit present.     Mental Status: She is alert and oriented to person, place, and time.  Psychiatric:        Mood and Affect: Mood normal.        Behavior: Behavior normal.        Thought Content: Thought content normal.    BP 136/84    Pulse 80    Resp 18    Ht _0  (1.676 m)    Wt 171 lb 3.2 oz (77.7 kg)    LMP 01/06/2022 (Exact Date)    BMI 27.63 kg/m   Wt Readings from Last 3 Encounters:  01/14/22 171 lb 3.2 oz (77.7 kg)  09/04/21 174 lb (78.9 kg)  07/28/21 160 lb (72.6 kg)     Health Maintenance Due  Topic Date Due   TETANUS/TDAP  03/30/2021    COVID-19 Vaccine (4 - Booster for Pfizer series) 10/30/2021    There are no preventive care reminders to display for this patient.   Lab Results  Component Value Date   TSH 4.100 09/04/2021   Lab Results  Component Value Date   WBC 7.4 09/04/2021  HGB 14.5 09/04/2021   HCT 41.8 09/04/2021   MCV 90 09/04/2021   PLT 401 09/04/2021   Lab Results  Component Value Date   NA 137 09/04/2021   K 4.6 09/04/2021   CO2 21 09/04/2021   GLUCOSE 92 09/04/2021   BUN 9 09/04/2021   CREATININE 0.83 09/04/2021   BILITOT 0.7 09/04/2021   ALKPHOS 76 09/04/2021   AST 19 09/04/2021   ALT 20 09/04/2021   PROT 7.3 09/04/2021   ALBUMIN 4.8 09/04/2021   CALCIUM 9.6 09/04/2021   ANIONGAP 7 03/24/2020   EGFR 94 09/04/2021   Lab Results  Component Value Date   CHOL 212 (H) 09/04/2021   Lab Results  Component Value Date   HDL 68 09/04/2021   Lab Results  Component Value Date   LDLCALC 128 (H) 09/04/2021   Lab Results  Component Value Date   TRIG 91 09/04/2021   Lab Results  Component Value Date   CHOLHDL 3.1 09/04/2021   Lab Results  Component Value Date   HGBA1C 5.1 04/26/2020       Assessment & Plan:   Problem List Items Addressed This Visit       Other   Environmental and seasonal allergies   Other Visit Diagnoses     Other headache syndrome    -  Primary   Relevant Medications   acetaminophen (TYLENOL) 500 MG tablet      Rest, continue to drink plenty of fluids, continue OTC NSAIDS with food or Tylenol; will check labs; if intense headaches with photophobia and nausea continue, can try a triptan for migraine headaches; use OTC nasal steroids for post nasal drip / allergies.  No orders of the defined types were placed in this encounter.  Return in 08/2022 for annual exam.  Irene Pap, PA-C

## 2022-01-14 NOTE — Patient Instructions (Signed)
You can take an over the counter antihistamine to help with allergic rhinitis / itching / hives: NON-DROWSY Allegra (Fexofenadine) 180 mg daily or NON-Drowsy Claritin (Loratidine) 10 mg daily  or DROWSY Benadryl (Diphenhydramine) 25 mg as directed or Zyrtec (Cetirizine) 10 mg daily. You can go to a store with a pharmacy and ask them to help you find these medicines. ? ?For nasal congestion and post nasal drip you can use an OTC nasal saline rinse as well as one of the OTC nasal steroids (generic equivalent) like Flonase (Fluticasone) or Rhinocort (Budesonide) or Nasacort (Triamcinolone) or Nasonex (Mometasone Furoate).  ? ?

## 2022-01-15 LAB — CBC WITH DIFFERENTIAL/PLATELET
Basophils Absolute: 0.1 10*3/uL (ref 0.0–0.2)
Basos: 1 %
EOS (ABSOLUTE): 0.1 10*3/uL (ref 0.0–0.4)
Eos: 2 %
Hematocrit: 39.9 % (ref 34.0–46.6)
Hemoglobin: 13.3 g/dL (ref 11.1–15.9)
Immature Grans (Abs): 0 10*3/uL (ref 0.0–0.1)
Immature Granulocytes: 0 %
Lymphocytes Absolute: 2.7 10*3/uL (ref 0.7–3.1)
Lymphs: 41 %
MCH: 30.2 pg (ref 26.6–33.0)
MCHC: 33.3 g/dL (ref 31.5–35.7)
MCV: 91 fL (ref 79–97)
Monocytes Absolute: 0.4 10*3/uL (ref 0.1–0.9)
Monocytes: 6 %
Neutrophils Absolute: 3.2 10*3/uL (ref 1.4–7.0)
Neutrophils: 50 %
Platelets: 439 10*3/uL (ref 150–450)
RBC: 4.41 x10E6/uL (ref 3.77–5.28)
RDW: 12 % (ref 11.7–15.4)
WBC: 6.5 10*3/uL (ref 3.4–10.8)

## 2022-01-15 LAB — COMPREHENSIVE METABOLIC PANEL
ALT: 23 IU/L (ref 0–32)
AST: 22 IU/L (ref 0–40)
Albumin/Globulin Ratio: 1.9 (ref 1.2–2.2)
Albumin: 4.8 g/dL (ref 3.8–4.8)
Alkaline Phosphatase: 84 IU/L (ref 44–121)
BUN/Creatinine Ratio: 11 (ref 9–23)
BUN: 9 mg/dL (ref 6–20)
Bilirubin Total: 0.5 mg/dL (ref 0.0–1.2)
CO2: 24 mmol/L (ref 20–29)
Calcium: 9.5 mg/dL (ref 8.7–10.2)
Chloride: 103 mmol/L (ref 96–106)
Creatinine, Ser: 0.85 mg/dL (ref 0.57–1.00)
Globulin, Total: 2.5 g/dL (ref 1.5–4.5)
Glucose: 84 mg/dL (ref 70–99)
Potassium: 4.3 mmol/L (ref 3.5–5.2)
Sodium: 140 mmol/L (ref 134–144)
Total Protein: 7.3 g/dL (ref 6.0–8.5)
eGFR: 91 mL/min/{1.73_m2} (ref >59)

## 2022-01-15 LAB — TSH+FREE T4
Free T4: 1.14 ng/dL (ref 0.82–1.77)
TSH: 4.06 u[IU]/mL (ref 0.450–4.500)

## 2022-06-04 ENCOUNTER — Ambulatory Visit: Payer: 59 | Admitting: Medical

## 2022-06-04 VITALS — BP 120/80 | HR 88 | Temp 97.8°F | Resp 16 | Wt 175.8 lb

## 2022-06-04 DIAGNOSIS — R051 Acute cough: Secondary | ICD-10-CM | POA: Diagnosis not present

## 2022-06-04 DIAGNOSIS — R062 Wheezing: Secondary | ICD-10-CM

## 2022-06-04 DIAGNOSIS — J309 Allergic rhinitis, unspecified: Secondary | ICD-10-CM | POA: Diagnosis not present

## 2022-06-04 MED ORDER — FLUTICASONE FUROATE-VILANTEROL 100-25 MCG/ACT IN AEPB: 1.0000 | INHALATION_SPRAY | Freq: Every day | RESPIRATORY_TRACT | 0 refills | Status: DC

## 2022-06-04 MED ORDER — BENZONATATE 100 MG PO CAPS: 100.0000 mg | ORAL_CAPSULE | Freq: Two times a day (BID) | ORAL | 0 refills | Status: DC | PRN

## 2022-06-04 MED ORDER — PREDNISONE 10 MG PO TABS: ORAL_TABLET | ORAL | 0 refills | Status: DC

## 2022-06-04 NOTE — Progress Notes (Signed)
Subjective:  Yesenia Johnson is a 36 y.o. female who presents for Chief Complaint  Patient presents with   cough    Cough x over a week ago, congestion x 1.5 weeks, post nasal drainage, wheezing and throat feels burning. Covid negative Wednesday June 28th, June 29th. Had telehealth appt Sunday and was given 4 meds to use. Doesn't know all the names but on inhaler and prednisone. Using inhaler 3-4 times a day     Here for cough, wheezing, congestion.   Had initial sore throat and congestion 3 weeks ago, but cough 1.5 weeks, wheezing, irritated throat, cough.  No recent body aches or chills, no fever, no nausea, no vomiting.   No head or sinus pressure.   No ear pain.  Had covid exposure 2 weeks ago, but tested twice in the past 2 weeks, and was negative for covid.   No other sick exposures.    Did teleheatlh visit, was advised it was likely allergies .  Put on inhaler, prednisone, 2 pills for 5 days, currently on day 3.  Was also prescribed allergy tablet, no antibiotic.  Was also advised flonase.   She denies hx/o asthma.   Had covid 10/2020, had significant issues with breathing then and was on albuterol then.     Nonsmoker.   She has 2 dogs and 5 foster kittens and their mother.  The kittens will be gone by end of the month.    No other aggravating or relieving factors.    No other c/o.  Past Medical History:  Diagnosis Date   Depression    Current Outpatient Medications on File Prior to Visit  Medication Sig Dispense Refill   albuterol (VENTOLIN HFA) 108 (90 Base) MCG/ACT inhaler Inhale into the lungs every 6 (six) hours as needed for wheezing or shortness of breath.     lamoTRIgine (LAMICTAL) 150 MG tablet Take 150 mg by mouth daily.     PREDNISONE PO Take by mouth. 2 pills every morning x 2     venlafaxine XR (EFFEXOR-XR) 150 MG 24 hr capsule Take 150 mg by mouth every morning.     No current facility-administered medications on file prior to visit.    The following portions of  the patient's history were reviewed and updated as appropriate: allergies, current medications, past family history, past medical history, past social history, past surgical history and problem list.  ROS Otherwise as in subjective above    Objective: BP 120/80   Pulse 88   Temp 97.8 F (36.6 C)   Resp 16   Wt 175 lb 12.8 oz (79.7 kg)   SpO2 99%   BMI 28.37 kg/m   General appearance: alert, no distress, well developed, well nourished HEENT: normocephalic, sclerae anicteric, conjunctiva pink and moist, TMs with air fluid levels, nares with turbinated edema, and clear discharge, pharynx normal Oral cavity: MMM, no lesions Neck: supple, no lymphadenopathy, no thyromegaly, no masses Heart: RRR, normal S1, S2, no murmurs Lungs: CTA bilaterally, no wheezes, rhonchi, or rales Pulses: 2+ radial pulses, 2+ pedal pulses, normal cap refill Ext: no edema   Assessment: Encounter Diagnoses  Name Primary?   Allergic rhinitis, unspecified seasonality, unspecified trigger Yes   Wheezing    Acute cough      Plan: We discussed symptoms and possible triggers.  This seems more allergic asthma than anything else.  She does not really appear ill.  Exam would suggest allergies and allergic asthma.  We discussed possible environmental allergens but she  has had the symptoms worse when she has been around multiple cats in her house that she is fostering.  I suspect this is a cat dander allergy.  I advised she try to find a way to have the cats in a separate part of the house.  That accounts will be completely out of the house at the end of July coming up soon.  In the meantime she will begin trial of Breo inhaler.  We discussed the proper use of this medication.  She will continue albuterol every 6 hours as needed, continue antihistamine daily for the next 2 weeks.  Finish out the prednisone she already has at home.  However if not seeing significant improvements within the next 48 to 72 hours begin to  taper dose of steroid and call back.  As long as she is improving over the next few days then no need to follow-up.  If not much improved over the next 48 hours then come back.  We will plan to do a baseline spirometry at her October physical  I gave the option of allergy testing or referral to allergist.  She declines at this time  Ramata was seen today for cough.  Diagnoses and all orders for this visit:  Allergic rhinitis, unspecified seasonality, unspecified trigger  Wheezing  Acute cough  Other orders -     benzonatate (TESSALON) 100 MG capsule; Take 1 capsule (100 mg total) by mouth 2 (two) times daily as needed for cough. -     fluticasone furoate-vilanterol (BREO ELLIPTA) 100-25 MCG/ACT AEPB; Inhale 1 puff into the lungs daily. -     predniSONE (DELTASONE) 10 MG tablet; 6 tablets all together day 1, 5 tablets day 2, 4 tablets day 3, 3 tablets day 4, 2 tablets day 5, 1 tablet day 6.   Follow up: call report in 3-4 days

## 2022-10-08 ENCOUNTER — Encounter: Payer: Self-pay | Admitting: Internal Medicine

## 2024-04-21 ENCOUNTER — Other Ambulatory Visit (HOSPITAL_COMMUNITY)
Admission: RE | Admit: 2024-04-21 | Discharge: 2024-04-21 | Disposition: A | Discharge status: Home / Self Care | Source: Ambulatory Visit | Attending: Family Medicine | Admitting: Family Medicine

## 2024-04-21 ENCOUNTER — Ambulatory Visit: Admitting: Family Medicine

## 2024-04-21 VITALS — BP 110/78 | HR 80 | Temp 97.9°F | Ht 66.0 in | Wt 192.0 lb

## 2024-04-21 DIAGNOSIS — E7800 Pure hypercholesterolemia, unspecified: Secondary | ICD-10-CM

## 2024-04-21 DIAGNOSIS — E559 Vitamin D deficiency, unspecified: Secondary | ICD-10-CM | POA: Diagnosis not present

## 2024-04-21 DIAGNOSIS — Z124 Encounter for screening for malignant neoplasm of cervix: Secondary | ICD-10-CM

## 2024-04-21 DIAGNOSIS — E66811 Obesity, class 1: Secondary | ICD-10-CM

## 2024-04-21 DIAGNOSIS — M25511 Pain in right shoulder: Secondary | ICD-10-CM

## 2024-04-21 DIAGNOSIS — F419 Anxiety disorder, unspecified: Secondary | ICD-10-CM

## 2024-04-21 DIAGNOSIS — Z113 Encounter for screening for infections with a predominantly sexual mode of transmission: Secondary | ICD-10-CM | POA: Insufficient documentation

## 2024-04-21 DIAGNOSIS — Z833 Family history of diabetes mellitus: Secondary | ICD-10-CM

## 2024-04-21 DIAGNOSIS — F32A Depression, unspecified: Secondary | ICD-10-CM

## 2024-04-21 LAB — CBC WITH DIFFERENTIAL/PLATELET
Basophils Absolute: 0 10*3/uL (ref 0.0–0.1)
Basophils Relative: 0.5 % (ref 0.0–3.0)
Eosinophils Absolute: 0.2 10*3/uL (ref 0.0–0.7)
Eosinophils Relative: 1.9 % (ref 0.0–5.0)
HCT: 42.7 % (ref 36.0–46.0)
Hemoglobin: 14.6 g/dL (ref 12.0–15.0)
Lymphocytes Relative: 30 % (ref 12.0–46.0)
Lymphs Abs: 3 10*3/uL (ref 0.7–4.0)
MCHC: 34.3 g/dL (ref 30.0–36.0)
MCV: 88.3 fl (ref 78.0–100.0)
Monocytes Absolute: 0.6 10*3/uL (ref 0.1–1.0)
Monocytes Relative: 5.9 % (ref 3.0–12.0)
Neutro Abs: 6.2 10*3/uL (ref 1.4–7.7)
Neutrophils Relative %: 61.7 % (ref 43.0–77.0)
Platelets: 389 10*3/uL (ref 150.0–400.0)
RBC: 4.83 Mil/uL (ref 3.87–5.11)
RDW: 12.4 % (ref 11.5–15.5)
WBC: 10 10*3/uL (ref 4.0–10.5)

## 2024-04-21 LAB — COMPREHENSIVE METABOLIC PANEL WITH GFR
ALT: 73 U/L — ABNORMAL HIGH (ref 0–35)
AST: 32 U/L (ref 0–37)
Albumin: 4.3 g/dL (ref 3.5–5.2)
Alkaline Phosphatase: 106 U/L (ref 39–117)
BUN: 16 mg/dL (ref 6–23)
CO2: 24 meq/L (ref 19–32)
Calcium: 9.2 mg/dL (ref 8.4–10.5)
Chloride: 102 meq/L (ref 96–112)
Creatinine, Ser: 0.96 mg/dL (ref 0.40–1.20)
GFR: 75.14 mL/min (ref >60.00)
Glucose, Bld: 89 mg/dL (ref 70–99)
Potassium: 3.8 meq/L (ref 3.5–5.1)
Sodium: 135 meq/L (ref 135–145)
Total Bilirubin: 1.4 mg/dL — ABNORMAL HIGH (ref 0.2–1.2)
Total Protein: 7.7 g/dL (ref 6.0–8.3)

## 2024-04-21 LAB — LIPID PANEL
Cholesterol: 207 mg/dL — ABNORMAL HIGH (ref 0–200)
HDL: 48.4 mg/dL (ref >39.00)
LDL Cholesterol: 131 mg/dL — ABNORMAL HIGH (ref 0–99)
NonHDL: 158.42
Total CHOL/HDL Ratio: 4
Triglycerides: 136 mg/dL (ref 0.0–149.0)
VLDL: 27.2 mg/dL (ref 0.0–40.0)

## 2024-04-21 LAB — VITAMIN D 25 HYDROXY (VIT D DEFICIENCY, FRACTURES): VITD: 21.5 ng/mL — ABNORMAL LOW (ref 30.00–100.00)

## 2024-04-21 LAB — TSH: TSH: 4.68 u[IU]/mL (ref 0.35–5.50)

## 2024-04-21 LAB — HEMOGLOBIN A1C: Hgb A1c MFr Bld: 5.4 % (ref 4.6–6.5)

## 2024-04-21 LAB — T4, FREE: Free T4: 0.85 ng/dL (ref 0.60–1.60)

## 2024-04-21 MED ORDER — MELOXICAM 15 MG PO TABS: 15.0000 mg | ORAL_TABLET | Freq: Every day | ORAL | 0 refills | Status: DC

## 2024-04-21 NOTE — Patient Instructions (Signed)
 Please go downstairs for labs before you leave.  For your shoulder, try over-the-counter Voltaren gel and take meloxicam by mouth daily for the next 1 to 2 weeks.  Let me know in 4 weeks if you are not improving significantly or sooner if you are getting worse.  I will refer you to [place] sports medicine if needed.  We will be in touch with your results.  I referred you to [facility] gynecology and they will call you to schedule a visit.

## 2024-04-21 NOTE — Progress Notes (Signed)
 New Patient Office Visit  Subjective    Patient ID: Yesenia Johnson, female    DOB: 1986/07/27  Age: 38 y.o. MRN: [id number]  CC:  Chief Complaint  Patient presents with   Establish Care    Wants to get STD testing done Issues w pain in right shoulder, has been going on a couple months     HPI [name] presents to establish care   Psychiatrist - [facility]  Working on depression and anxiety and stopped all of her mental health medications over a month ago.  States she had previously been on medication for 17 years.  Her psychiatrist is aware  She is in therapy   Divorce will be final next week.  Works as Engineer, civil (consulting)   C/o 2 month hx of right shoulder pain  No injury or hx of same.  Pain with certain movements in the front of her shoulder and when sleeping on her right shoulder.  Denies numbness, tingling, weakness.  Request testing for STDs.  Asymptomatic.     Outpatient Encounter Medications as of 04/21/2024  Medication Sig   levocetirizine (XYZAL) 5 MG tablet    meloxicam (MOBIC) 15 MG tablet Take 1 tablet (15 mg total) by mouth daily.   [DISCONTINUED] albuterol  (VENTOLIN  HFA) 108 (90 Base) MCG/ACT inhaler Inhale into the lungs every 6 (six) hours as needed for wheezing or shortness of breath.   [DISCONTINUED] benzonatate  (TESSALON ) 100 MG capsule Take 1 capsule (100 mg total) by mouth 2 (two) times daily as needed for cough.   [DISCONTINUED] fluticasone  furoate-vilanterol (BREO ELLIPTA ) 100-25 MCG/ACT AEPB Inhale 1 puff into the lungs daily.   [DISCONTINUED] lamoTRIgine (LAMICTAL) 150 MG tablet Take 150 mg by mouth daily.   [DISCONTINUED] predniSONE  (DELTASONE ) 10 MG tablet 6 tablets all together day 1, 5 tablets day 2, 4 tablets day 3, 3 tablets day 4, 2 tablets day 5, 1 tablet day 6.   [DISCONTINUED] PREDNISONE  PO Take by mouth. 2 pills every morning x 2   [DISCONTINUED] venlafaxine  XR (EFFEXOR -XR) 150 MG 24 hr capsule Take 150 mg by mouth every  morning.   No facility-administered encounter medications on file as of 04/21/2024.    Past Medical History:  Diagnosis Date   Depression     Past Surgical History:  Procedure Laterality Date   CHOLECYSTECTOMY N/A 03/24/2020   Procedure: LAPAROSCOPIC CHOLECYSTECTOMY WITH INTRAOPERATIVE CHOLANGIOGRAM;  Surgeon: Oralee Billow, MD;  Location: [facility];  Service: General;  Laterality: N/A;   COLPOSCOPY     WISDOM TOOTH EXTRACTION  11/2016    Family History  Problem Relation Age of Onset   Hypertension Mother    Diabetes Mother    Stroke Maternal Grandmother    Diabetes Paternal Grandmother    Breast cancer Maternal Aunt    Alcoholism Maternal Aunt    Diabetes Paternal Aunt     Social History   Socioeconomic History   Marital status: Married    Spouse name: Not on file   Number of children: Not on file   Years of education: Not on file   Highest education level: Master's degree (e.g., MA, MS, MEng, MEd, MSW, MBA)  Occupational History   Not on file  Tobacco Use   Smoking status: Never   Smokeless tobacco: Never  Vaping Use   Vaping status: Never Used  Substance and Sexual Activity   Alcohol use: Yes    Alcohol/week: 2.0 standard drinks of alcohol    Types: 2 Cans of beer  per week    Comment: 3-4 drinks per week    Drug use: No   Sexual activity: Yes    Partners: Male    Birth control/protection: None  Other Topics Concern   Not on file  Social History Narrative   Not on file   Social Drivers of Health   Financial Resource Strain: Low Risk  (04/21/2024)   Overall Financial Resource Strain (CARDIA)    Difficulty of Paying Living Expenses: Not very hard  Food Insecurity: No Food Insecurity (04/21/2024)   Hunger Vital Sign    Worried About Running Out of Food in the Last Year: Never true    Ran Out of Food in the Last Year: Never true  Transportation Needs: No Transportation Needs (04/21/2024)   PRAPARE - Administrator, Civil Service (Medical): No     Lack of Transportation (Non-Medical): No  Physical Activity: Insufficiently Active (04/21/2024)   Exercise Vital Sign    Days of Exercise per Week: 3 days    Minutes of Exercise per Session: 30 min  Stress: Stress Concern Present (04/21/2024)   [employer] of Occupational Health - Occupational Stress Questionnaire    Feeling of Stress : To some extent  Social Connections: Socially Isolated (04/21/2024)   Social Connection and Isolation Panel [NHANES]    Frequency of Communication with Friends and Family: Once a week    Frequency of Social Gatherings with Friends and Family: Once a week    Attends Religious Services: Never    Database administrator or Organizations: Yes    Attends Engineer, structural: More than 4 times per year    Marital Status: Separated  Intimate Partner Violence: Not on file    Review of Systems  Constitutional:  Negative for chills, fever, malaise/fatigue and weight loss.  Respiratory:  Negative for shortness of breath.   Cardiovascular:  Negative for chest pain, palpitations and leg swelling.  Gastrointestinal:  Negative for abdominal pain, constipation, diarrhea, nausea and vomiting.  Genitourinary:  Negative for dysuria, frequency and urgency.  Musculoskeletal:  Positive for joint pain. Negative for back pain and neck pain.  Neurological:  Negative for dizziness, tingling, focal weakness and headaches.  Endo/Heme/Allergies:  Negative for polydipsia.  Psychiatric/Behavioral:  Positive for depression. Negative for substance abuse and suicidal ideas. The patient is nervous/anxious.         Objective    BP 110/78 (BP Location: Left Arm, Patient Position: Sitting)   Pulse 80   Temp 97.9 F (36.6 C) (Temporal)   Ht 5\' 6"  (1.676 m)   Wt 192 lb (87.1 kg)   SpO2 95%   BMI 30.99 kg/m   Physical Exam Constitutional:      General: She is not in acute distress.    Appearance: She is not ill-appearing.  HENT:     Mouth/Throat:     Mouth:  Mucous membranes are moist.  Eyes:     Extraocular Movements: Extraocular movements intact.     Conjunctiva/sclera: Conjunctivae normal.  Cardiovascular:     Rate and Rhythm: Normal rate and regular rhythm.  Pulmonary:     Effort: Pulmonary effort is normal.     Breath sounds: Normal breath sounds.  Musculoskeletal:     Right shoulder: Tenderness present. No effusion. Decreased range of motion. Normal strength. Normal pulse.     Cervical back: Normal range of motion and neck supple. No tenderness.     Comments: Anterior shoulder TTP and pain with extension and supination  Lymphadenopathy:     Cervical: No cervical adenopathy.  Skin:    General: Skin is warm and dry.  Neurological:     General: No focal deficit present.     Mental Status: She is alert and oriented to person, place, and time.     Cranial Nerves: No cranial nerve deficit.     Sensory: No sensory deficit.     Motor: No weakness.     Coordination: Coordination normal.     Gait: Gait normal.  Psychiatric:        Mood and Affect: Mood normal.        Behavior: Behavior normal.        Thought Content: Thought content normal.         Assessment & Plan:   Problem List Items Addressed This Visit     Family history of diabetes mellitus in mother   Relevant Orders   Hemoglobin A1c   Vitamin D  deficiency   Relevant Orders   VITAMIN D  25 Hydroxy (Vit-D Deficiency, Fractures)   Other Visit Diagnoses       Acute pain of right shoulder    -  Primary     Screen for STD (sexually transmitted disease)       Relevant Orders   Hepatitis C antibody   RPR   Cervicovaginal ancillary only   HIV Antibody (routine testing w rflx)   Hepatitis B surface antigen     Anxiety and depression         Screening for cervical cancer       Relevant Orders   Ambulatory referral to Gynecology     Obesity (BMI 30.0-34.9)       Relevant Orders   CBC with Differential/Platelet   Comprehensive metabolic panel with GFR   Hemoglobin  A1c   Lipid panel   TSH   T4, free     Pure hypercholesterolemia       Relevant Orders   Lipid panel      She is a pleasant 38 year old female who is here to establish care.  She was my patient at my former practice.  I last saw her in 2022. Acute right shoulder pain without injury.  Suspect bicep tendinitis.  Meloxicam  prescribed.  She will try over-the-counter Voltaren gel.  She will let me know if worsening or no improvement and I will refer her to [facility] sports medicine.  Imaging discussed but not warranted. She would like labs today.  Requests screening for STDs due to potential new partner.  Denies symptoms. Elevated LDL in the past.  She is fasting.  Check lipid panel. Diabetes runs in her family.  BMI greater than 30.  Check A1c and other labs. She is due for Pap smear.  Referral to establish with gynecology.  Denies desire for pregnancy. Vitamin D  deficiency in the past.  Replace as warranted. Continue with therapy and psychiatry.  She is off of her medications for now and psychiatrist aware.  Follow-up pending lab results  No follow-ups on file.   Alyson Back, NP-C

## 2024-04-22 ENCOUNTER — Other Ambulatory Visit: Payer: Self-pay | Admitting: Family Medicine

## 2024-04-22 ENCOUNTER — Ambulatory Visit: Payer: Self-pay | Admitting: Family Medicine

## 2024-04-22 LAB — HEPATITIS B SURFACE ANTIGEN: Hepatitis B Surface Ag: NONREACTIVE

## 2024-04-22 LAB — CERVICOVAGINAL ANCILLARY ONLY
Bacterial Vaginitis (gardnerella): POSITIVE — AB
Candida Glabrata: NEGATIVE
Candida Vaginitis: POSITIVE — AB
Chlamydia: NEGATIVE
Comment: NEGATIVE
Comment: NEGATIVE
Comment: NEGATIVE
Comment: NEGATIVE
Comment: NEGATIVE
Comment: NORMAL
Neisseria Gonorrhea: NEGATIVE
Trichomonas: NEGATIVE

## 2024-04-22 LAB — HIV ANTIBODY (ROUTINE TESTING W REFLEX): HIV 1&2 Ab, 4th Generation: NONREACTIVE

## 2024-04-22 LAB — RPR: RPR Ser Ql: NONREACTIVE

## 2024-04-22 LAB — HEPATITIS C ANTIBODY: Hepatitis C Ab: NONREACTIVE

## 2024-04-22 MED ORDER — METRONIDAZOLE 500 MG PO TABS: 500.0000 mg | ORAL_TABLET | Freq: Two times a day (BID) | ORAL | 0 refills | Status: DC

## 2024-04-22 MED ORDER — FLUCONAZOLE 150 MG PO TABS
150.0000 mg | ORAL_TABLET | Freq: Once | ORAL | 0 refills | Status: AC
Start: 2024-04-22 — End: 2024-04-22

## 2024-04-28 NOTE — Telephone Encounter (Signed)
 Please advise if pt needs to hold off on starting any new meds until after rechecking liver values?

## 2024-05-18 ENCOUNTER — Other Ambulatory Visit (INDEPENDENT_AMBULATORY_CARE_PROVIDER_SITE_OTHER)

## 2024-05-18 ENCOUNTER — Other Ambulatory Visit: Payer: Self-pay | Admitting: Family Medicine

## 2024-05-18 DIAGNOSIS — R7401 Elevation of levels of liver transaminase levels: Secondary | ICD-10-CM

## 2024-05-18 LAB — HEPATIC FUNCTION PANEL
ALT: 71 U/L — ABNORMAL HIGH (ref 0–35)
AST: 50 U/L — ABNORMAL HIGH (ref 0–37)
Albumin: 4.3 g/dL (ref 3.5–5.2)
Alkaline Phosphatase: 99 U/L (ref 39–117)
Bilirubin, Direct: 0.1 mg/dL (ref 0.0–0.3)
Total Bilirubin: 0.7 mg/dL (ref 0.2–1.2)
Total Protein: 7.6 g/dL (ref 6.0–8.3)

## 2024-05-19 ENCOUNTER — Ambulatory Visit: Payer: Self-pay | Admitting: Family Medicine

## 2024-05-19 ENCOUNTER — Other Ambulatory Visit: Payer: Self-pay | Admitting: Family Medicine

## 2024-05-19 DIAGNOSIS — R748 Abnormal levels of other serum enzymes: Secondary | ICD-10-CM

## 2024-05-19 LAB — HEPATITIS PANEL, ACUTE
Hep A IgM: NONREACTIVE
Hep B C IgM: NONREACTIVE
Hepatitis B Surface Ag: NONREACTIVE
Hepatitis C Ab: NONREACTIVE

## 2024-05-19 NOTE — Progress Notes (Signed)
 Her liver enzymes are both elevated now.  I would like to refer her to [facility] for further evaluation of her liver.  Acute hepatitis panel was negative.

## 2024-06-07 ENCOUNTER — Encounter: Payer: Self-pay | Admitting: Physician Assistant

## 2024-06-21 ENCOUNTER — Encounter: Admitting: Radiology

## 2024-06-27 DIAGNOSIS — F332 Major depressive disorder, recurrent severe without psychotic features: Secondary | ICD-10-CM | POA: Diagnosis not present

## 2024-06-29 DIAGNOSIS — F332 Major depressive disorder, recurrent severe without psychotic features: Secondary | ICD-10-CM | POA: Diagnosis not present

## 2024-06-30 DIAGNOSIS — F332 Major depressive disorder, recurrent severe without psychotic features: Secondary | ICD-10-CM | POA: Diagnosis not present

## 2024-07-08 DIAGNOSIS — F429 Obsessive-compulsive disorder, unspecified: Secondary | ICD-10-CM | POA: Diagnosis not present

## 2024-07-08 DIAGNOSIS — F329 Major depressive disorder, single episode, unspecified: Secondary | ICD-10-CM | POA: Diagnosis not present

## 2024-07-15 DIAGNOSIS — F419 Anxiety disorder, unspecified: Secondary | ICD-10-CM | POA: Diagnosis not present

## 2024-07-15 DIAGNOSIS — F331 Major depressive disorder, recurrent, moderate: Secondary | ICD-10-CM | POA: Diagnosis not present

## 2024-07-22 DIAGNOSIS — F329 Major depressive disorder, single episode, unspecified: Secondary | ICD-10-CM | POA: Diagnosis not present

## 2024-07-22 DIAGNOSIS — F429 Obsessive-compulsive disorder, unspecified: Secondary | ICD-10-CM | POA: Diagnosis not present

## 2024-07-29 ENCOUNTER — Other Ambulatory Visit

## 2024-07-29 ENCOUNTER — Ambulatory Visit (INDEPENDENT_AMBULATORY_CARE_PROVIDER_SITE_OTHER): Payer: Self-pay | Admitting: Physician Assistant

## 2024-07-29 ENCOUNTER — Encounter: Payer: Self-pay | Admitting: Physician Assistant

## 2024-07-29 VITALS — BP 138/84 | HR 70 | Ht 66.0 in | Wt 189.0 lb

## 2024-07-29 DIAGNOSIS — R945 Abnormal results of liver function studies: Secondary | ICD-10-CM

## 2024-07-29 DIAGNOSIS — F429 Obsessive-compulsive disorder, unspecified: Secondary | ICD-10-CM | POA: Diagnosis not present

## 2024-07-29 DIAGNOSIS — Z9049 Acquired absence of other specified parts of digestive tract: Secondary | ICD-10-CM | POA: Diagnosis not present

## 2024-07-29 DIAGNOSIS — R7989 Other specified abnormal findings of blood chemistry: Secondary | ICD-10-CM

## 2024-07-29 DIAGNOSIS — R630 Anorexia: Secondary | ICD-10-CM

## 2024-07-29 DIAGNOSIS — R195 Other fecal abnormalities: Secondary | ICD-10-CM | POA: Diagnosis not present

## 2024-07-29 DIAGNOSIS — F329 Major depressive disorder, single episode, unspecified: Secondary | ICD-10-CM | POA: Diagnosis not present

## 2024-07-29 LAB — B12 AND FOLATE PANEL
Folate: 12.3 ng/mL (ref >5.9)
Vitamin B-12: 520 pg/mL (ref 211–911)

## 2024-07-29 LAB — CBC WITH DIFFERENTIAL/PLATELET
Basophils Absolute: 0.1 K/uL (ref 0.0–0.1)
Basophils Relative: 0.5 % (ref 0.0–3.0)
Eosinophils Absolute: 0.1 K/uL (ref 0.0–0.7)
Eosinophils Relative: 1.1 % (ref 0.0–5.0)
HCT: 41 % (ref 36.0–46.0)
Hemoglobin: 14 g/dL (ref 12.0–15.0)
Lymphocytes Relative: 26.2 % (ref 12.0–46.0)
Lymphs Abs: 2.7 K/uL (ref 0.7–4.0)
MCHC: 34.2 g/dL (ref 30.0–36.0)
MCV: 89.1 fl (ref 78.0–100.0)
Monocytes Absolute: 0.4 K/uL (ref 0.1–1.0)
Monocytes Relative: 3.7 % (ref 3.0–12.0)
Neutro Abs: 7 K/uL (ref 1.4–7.7)
Neutrophils Relative %: 68.5 % (ref 43.0–77.0)
Platelets: 409 K/uL — ABNORMAL HIGH (ref 150.0–400.0)
RBC: 4.6 Mil/uL (ref 3.87–5.11)
RDW: 12.7 % (ref 11.5–15.5)
WBC: 10.2 K/uL (ref 4.0–10.5)

## 2024-07-29 LAB — COMPREHENSIVE METABOLIC PANEL WITH GFR
ALT: 144 U/L — ABNORMAL HIGH (ref 0–35)
AST: 71 U/L — ABNORMAL HIGH (ref 0–37)
Albumin: 4.2 g/dL (ref 3.5–5.2)
Alkaline Phosphatase: 126 U/L — ABNORMAL HIGH (ref 39–117)
BUN: 11 mg/dL (ref 6–23)
CO2: 27 meq/L (ref 19–32)
Calcium: 9 mg/dL (ref 8.4–10.5)
Chloride: 103 meq/L (ref 96–112)
Creatinine, Ser: 0.92 mg/dL (ref 0.40–1.20)
GFR: 78.93 mL/min (ref >60.00)
Glucose, Bld: 95 mg/dL (ref 70–99)
Potassium: 3.7 meq/L (ref 3.5–5.1)
Sodium: 138 meq/L (ref 135–145)
Total Bilirubin: 1.1 mg/dL (ref 0.2–1.2)
Total Protein: 7.5 g/dL (ref 6.0–8.3)

## 2024-07-29 LAB — PROTIME-INR
INR: 1.1 ratio — ABNORMAL HIGH (ref 0.8–1.0)
Prothrombin Time: 11.5 s (ref 9.6–13.1)

## 2024-07-29 LAB — IBC + FERRITIN
Ferritin: 31.6 ng/mL (ref 10.0–291.0)
Iron: 87 ug/dL (ref 42–145)
Saturation Ratios: 24.7 % (ref 20.0–50.0)
TIBC: 352.8 ug/dL (ref 250.0–450.0)
Transferrin: 252 mg/dL (ref 212.0–360.0)

## 2024-07-29 NOTE — Patient Instructions (Signed)
 Your provider has requested that you go to the basement level for lab work before leaving today. Press B on the elevator. The lab is located at the first door on the left as you exit the elevator.  You have been scheduled for an abdominal ultrasound at [facility] (1st floor of hospital) on Thursday 08/04/24 at 9:30 am. Please arrive 30 minutes prior to your appointment for registration. Make certain not to have anything to eat or drink 6 hours prior to your appointment. Should you need to reschedule your appointment, please contact radiology at [phone]. This test typically takes about 30 minutes to perform.

## 2024-07-29 NOTE — Progress Notes (Signed)
 Chief Complaint: Elevated LFTs  HPI:    Yesenia Johnson is a 38 year old female, previously known to Dr. Eda, with a past medical history as listed below including prior cholecystectomy, who was referred to me by Lendia Boby CROME, NP-C for a complaint of elevated LFTs.    03/23/20 right upper quadrant ultrasound with acute cholecystitis and markedly thickened and edematous gallbladder.  Cholelithiasis largest being 16 mm.  Liver was normal.    06/15/20 patient seen in clinic by Dr. Eda for bloating and abdominal pain.  At that time discussed epigastric pain off-and-on for years.  Recent cholecystectomy for symptomatic gallstones without change in pain or bloating.  Prescribe Dicyclomine  20 mg 4 times daily and recommend an EGD as well as celiac testing and H. pylori.    04/21/2024 CBC normal, CMP with a total bili 1.4 and ALT 73 and otherwise normal.  Hemoglobin A1c normal.  Vitamin D  low at 21.5.  Normal TSH.    05/18/2024 hepatic function panel with AST 50 and ALT 71.  (04/21/2024 total bili 1.4, ALT 73, AST 32, alk phos 106.    05/18/2024 acute hepatitis panel nonreactive.    Today, patient presents to clinic and tells me that she is doing well today otherwise.  She did have about a month period where she had softer stools than normal but they are now returning back to her regular solid stools.  Notes that she battles with depression and when she is depressed she loses her appetite.  Most recently she was started on Trintellix and still does not have an appetite.  She wonders if this has anything to do with her liver enzymes.  Family history of Gibert's syndrome in her father, she has a history of tattoos all done at reputable parlor's, she drinks occasionally but has stopped drinking completely since elevation of liver enzymes back in June.    Denies fever, chills, weight loss, nausea, vomiting or symptoms that awaken her from sleep.  Past Medical History:  Diagnosis Date   Depression     Past  Surgical History:  Procedure Laterality Date   CHOLECYSTECTOMY N/A 03/24/2020   Procedure: LAPAROSCOPIC CHOLECYSTECTOMY WITH INTRAOPERATIVE CHOLANGIOGRAM;  Surgeon: Eletha Boas, MD;  Location: [facility];  Service: General;  Laterality: N/A;   COLPOSCOPY     WISDOM TOOTH EXTRACTION  11/2016    Current Outpatient Medications  Medication Sig Dispense Refill   levocetirizine (XYZAL) 5 MG tablet      meloxicam  (MOBIC ) 15 MG tablet Take 1 tablet (15 mg total) by mouth daily. 30 tablet 0   metroNIDAZOLE  (FLAGYL ) 500 MG tablet Take 1 tablet (500 mg total) by mouth 2 (two) times daily. 14 tablet 0   No current facility-administered medications for this visit.    Allergies as of 07/29/2024 - Review Complete 04/21/2024  Allergen Reaction Noted   Penicillins Hives 06/16/2017    Family History  Problem Relation Age of Onset   Hypertension Mother    Diabetes Mother    Stroke Maternal Grandmother    Diabetes Paternal Grandmother    Breast cancer Maternal Aunt    Alcoholism Maternal Aunt    Diabetes Paternal Aunt     Social History   Socioeconomic History   Marital status: Married    Spouse name: Not on file   Number of children: Not on file   Years of education: Not on file   Highest education level: Master's degree (e.g., MA, MS, MEng, MEd, MSW, MBA)  Occupational History  Not on file  Tobacco Use   Smoking status: Never   Smokeless tobacco: Never  Vaping Use   Vaping status: Never Used  Substance and Sexual Activity   Alcohol use: Yes    Alcohol/week: 2.0 standard drinks of alcohol    Types: 2 Cans of beer per week    Comment: 3-4 drinks per week    Drug use: No   Sexual activity: Yes    Partners: Male    Birth control/protection: None  Other Topics Concern   Not on file  Social History Narrative   Not on file   Social Drivers of Health   Financial Resource Strain: Low Risk  (04/21/2024)   Overall Financial Resource Strain (CARDIA)    Difficulty of Paying Living  Expenses: Not very hard  Food Insecurity: No Food Insecurity (04/21/2024)   Hunger Vital Sign    Worried About Running Out of Food in the Last Year: Never true    Ran Out of Food in the Last Year: Never true  Transportation Needs: No Transportation Needs (04/21/2024)   PRAPARE - Administrator, Civil Service (Medical): No    Lack of Transportation (Non-Medical): No  Physical Activity: Insufficiently Active (04/21/2024)   Exercise Vital Sign    Days of Exercise per Week: 3 days    Minutes of Exercise per Session: 30 min  Stress: Stress Concern Present (04/21/2024)   [employer] of Occupational Health - Occupational Stress Questionnaire    Feeling of Stress : To some extent  Social Connections: Socially Isolated (04/21/2024)   Social Connection and Isolation Panel    Frequency of Communication with Friends and Family: Once a week    Frequency of Social Gatherings with Friends and Family: Once a week    Attends Religious Services: Never    Database administrator or Organizations: Yes    Attends Engineer, structural: More than 4 times per year    Marital Status: Separated  Intimate Partner Violence: Not on file    Review of Systems:    Constitutional: No weight loss, fever or chills Skin: No rash  Cardiovascular: No chest pain Respiratory: No SOB  Gastrointestinal: See HPI and otherwise negative Genitourinary: No dysuria  Neurological: No headache, dizziness or syncope Musculoskeletal: No new muscle or joint pain Hematologic: No bleeding  Psychiatric: +depression   Physical Exam:  Vital signs: BP 138/84   Pulse 70   Ht 5' 6 (1.676 m)   Wt 189 lb (85.7 kg)   BMI 30.51 kg/m    Constitutional:   Pleasant overweight Caucasian female appears to be in NAD, Well developed, Well nourished, alert and cooperative Head:  Normocephalic and atraumatic. Eyes:   PEERL, EOMI. No icterus. Conjunctiva pink. Ears:  Normal auditory acuity. Neck:  Supple Throat: Oral  cavity and pharynx without inflammation, swelling or lesion.  Respiratory: Respirations even and unlabored. Lungs clear to auscultation bilaterally.   No wheezes, crackles, or rhonchi.  Cardiovascular: Normal S1, S2. No MRG. Regular rate and rhythm. No peripheral edema, cyanosis or pallor.  Gastrointestinal:  Soft, nondistended, nontender. No rebound or guarding. Normal bowel sounds. No appreciable masses or hepatomegaly. Rectal:  Not performed.  Msk:  Symmetrical without gross deformities. Without edema, no deformity or joint abnormality.  Neurologic:  Alert and  oriented x4;  grossly normal neurologically.  Skin:   Dry and intact without significant lesions or rashes. Psychiatric: Demonstrates good judgement and reason without abnormal affect or behaviors.  RELEVANT LABS AND  IMAGING: CBC    Component Value Date/Time   WBC 10.0 04/21/2024 0914   RBC 4.83 04/21/2024 0914   HGB 14.6 04/21/2024 0914   HGB 13.3 01/14/2022 1322   HCT 42.7 04/21/2024 0914   HCT 39.9 01/14/2022 1322   PLT 389.0 04/21/2024 0914   PLT 439 01/14/2022 1322   MCV 88.3 04/21/2024 0914   MCV 91 01/14/2022 1322   MCH 30.2 01/14/2022 1322   MCH 30.7 03/24/2020 0542   MCHC 34.3 04/21/2024 0914   RDW 12.4 04/21/2024 0914   RDW 12.0 01/14/2022 1322   LYMPHSABS 3.0 04/21/2024 0914   LYMPHSABS 2.7 01/14/2022 1322   MONOABS 0.6 04/21/2024 0914   EOSABS 0.2 04/21/2024 0914   EOSABS 0.1 01/14/2022 1322   BASOSABS 0.0 04/21/2024 0914   BASOSABS 0.1 01/14/2022 1322    CMP     Component Value Date/Time   NA 135 04/21/2024 0914   NA 140 01/14/2022 1322   K 3.8 04/21/2024 0914   CL 102 04/21/2024 0914   CO2 24 04/21/2024 0914   GLUCOSE 89 04/21/2024 0914   BUN 16 04/21/2024 0914   BUN 9 01/14/2022 1322   CREATININE 0.96 04/21/2024 0914   CALCIUM 9.2 04/21/2024 0914   PROT 7.6 05/18/2024 1304   PROT 7.3 01/14/2022 1322   ALBUMIN 4.3 05/18/2024 1304   ALBUMIN 4.8 01/14/2022 1322   AST 50 (H) 05/18/2024 1304    ALT 71 (H) 05/18/2024 1304   ALKPHOS 99 05/18/2024 1304   BILITOT 0.7 05/18/2024 1304   BILITOT 0.5 01/14/2022 1322   GFRNONAA 90 04/26/2020 0922   GFRAA 103 04/26/2020 0922    Assessment: 1.  Elevated LFTs: Initially documented back in May, recheck in June with elevation of AST to 50 and ALT to 71, otherwise normal, last ultrasound in 2021 prior to cholecystectomy with no sign of fatty liver, does drink alcohol periodically, no family history of liver disease; likely this indicates fatty liver disease but will complete workup for autoimmune causes and other today as well 2.  Change in bowel habits: Likely related to a change in diet and/or IBS is related to her depression, now returning to normal 3.  Decreased appetite: Discussed that I do not think this is related to her minimally elevated liver enzymes, more than likely this is related to her depression +/- the new medication that they started her on for this  Plan: 1.  Check labs today including CBC, CMP, PT/INR, iron studies with ferritin, IgG, IgA, TTG, ANA, ASMA, AMA, alpha 1 antitrypsin and ceruloplasmin.  Can calculate fib 4 score if patient has fatty liver once labs return. 2.  Ordered right upper quadrant ultrasound 3.  Discussed fatty liver diagnosis with the patient, if this is her diagnosis then the recommendation is a slow and steady weight loss, healthy diet and management of any other accompanying factors including diabetes and/or high cholesterol. 4.  Patient to follow in clinic with us  per recommendations after labs and imaging above.  Assigned to Dr. Nandigam today.  Delon Failing, PA-C [facility] 07/29/2024, 10:22 AM  Cc: Henson, Vickie L, NP-C

## 2024-08-03 ENCOUNTER — Ambulatory Visit: Payer: Self-pay | Admitting: Physician Assistant

## 2024-08-03 LAB — CERULOPLASMIN: Ceruloplasmin: 29 mg/dL (ref 14–48)

## 2024-08-03 LAB — ALPHA-1-ANTITRYPSIN: A-1 Antitrypsin, Ser: 166 mg/dL (ref 83–199)

## 2024-08-03 LAB — TISSUE TRANSGLUTAMINASE ABS,IGG,IGA
(tTG) Ab, IgA: <1 U/mL
(tTG) Ab, IgG: <1 U/mL

## 2024-08-03 LAB — ANA: Anti Nuclear Antibody (ANA): NEGATIVE

## 2024-08-03 LAB — MITOCHONDRIAL ANTIBODIES: Mitochondrial M2 Ab, IgG: <=20 U (ref <=20.0)

## 2024-08-03 LAB — IGG: IgG (Immunoglobin G), Serum: 1433 mg/dL (ref 600–1640)

## 2024-08-03 LAB — ANTI-SMOOTH MUSCLE ANTIBODY, IGG: Actin (Smooth Muscle) Antibody (IGG): <20 U (ref <20)

## 2024-08-03 LAB — IGA: Immunoglobulin A: 230 mg/dL (ref 47–310)

## 2024-08-03 NOTE — Progress Notes (Signed)
 Awaiting RUQ U/S. Liver work up so far normal.  JLL

## 2024-08-04 ENCOUNTER — Ambulatory Visit (HOSPITAL_COMMUNITY)
Admission: RE | Admit: 2024-08-04 | Discharge: 2024-08-04 | Disposition: A | Discharge status: Home / Self Care | Source: Ambulatory Visit | Attending: Physician Assistant | Admitting: Physician Assistant

## 2024-08-04 DIAGNOSIS — R7989 Other specified abnormal findings of blood chemistry: Secondary | ICD-10-CM | POA: Diagnosis not present

## 2024-08-04 DIAGNOSIS — Z9049 Acquired absence of other specified parts of digestive tract: Secondary | ICD-10-CM | POA: Diagnosis not present

## 2024-08-05 DIAGNOSIS — F329 Major depressive disorder, single episode, unspecified: Secondary | ICD-10-CM | POA: Diagnosis not present

## 2024-08-05 DIAGNOSIS — F429 Obsessive-compulsive disorder, unspecified: Secondary | ICD-10-CM | POA: Diagnosis not present

## 2024-08-08 ENCOUNTER — Other Ambulatory Visit: Payer: Self-pay | Admitting: *Deleted

## 2024-08-08 DIAGNOSIS — R7989 Other specified abnormal findings of blood chemistry: Secondary | ICD-10-CM

## 2024-08-08 NOTE — Progress Notes (Signed)
 Please call the patient and let her know that her right upper quadrant ultrasound did show some likely fatty liver.  Her LFTs are a bit higher than I would expect to see in the state so would like us  to recheck them (2 weeks out from her last check).  If they remain elevated we may need to consider liver biopsy.  Thanks, JL L

## 2024-08-11 DIAGNOSIS — F329 Major depressive disorder, single episode, unspecified: Secondary | ICD-10-CM | POA: Diagnosis not present

## 2024-08-11 DIAGNOSIS — F429 Obsessive-compulsive disorder, unspecified: Secondary | ICD-10-CM | POA: Diagnosis not present

## 2024-08-15 ENCOUNTER — Other Ambulatory Visit (INDEPENDENT_AMBULATORY_CARE_PROVIDER_SITE_OTHER)

## 2024-08-15 DIAGNOSIS — R7989 Other specified abnormal findings of blood chemistry: Secondary | ICD-10-CM

## 2024-08-15 LAB — HEPATIC FUNCTION PANEL
ALT: 37 U/L — ABNORMAL HIGH (ref 0–35)
AST: 20 U/L (ref 0–37)
Albumin: 4.1 g/dL (ref 3.5–5.2)
Alkaline Phosphatase: 89 U/L (ref 39–117)
Bilirubin, Direct: 0.1 mg/dL (ref 0.0–0.3)
Total Bilirubin: 0.5 mg/dL (ref 0.2–1.2)
Total Protein: 6.8 g/dL (ref 6.0–8.3)

## 2024-08-17 ENCOUNTER — Ambulatory Visit: Payer: Self-pay | Admitting: Physician Assistant

## 2024-08-17 NOTE — Progress Notes (Signed)
 Please let the patient know that her liver enzymes are looking much better, they are almost completely back to normal.  Lets do 1 more recheck in a month from now.  As long as they are stable or normal then we do not need to do anything further.  Sincerely, Delon Failing, PA-C

## 2024-08-19 DIAGNOSIS — F329 Major depressive disorder, single episode, unspecified: Secondary | ICD-10-CM | POA: Diagnosis not present

## 2024-08-19 DIAGNOSIS — F429 Obsessive-compulsive disorder, unspecified: Secondary | ICD-10-CM | POA: Diagnosis not present

## 2024-08-26 DIAGNOSIS — F419 Anxiety disorder, unspecified: Secondary | ICD-10-CM | POA: Diagnosis not present

## 2024-08-26 DIAGNOSIS — F331 Major depressive disorder, recurrent, moderate: Secondary | ICD-10-CM | POA: Diagnosis not present

## 2024-08-29 DIAGNOSIS — F329 Major depressive disorder, single episode, unspecified: Secondary | ICD-10-CM | POA: Diagnosis not present

## 2024-08-29 DIAGNOSIS — F429 Obsessive-compulsive disorder, unspecified: Secondary | ICD-10-CM | POA: Diagnosis not present

## 2024-09-02 DIAGNOSIS — F329 Major depressive disorder, single episode, unspecified: Secondary | ICD-10-CM | POA: Diagnosis not present

## 2024-09-02 DIAGNOSIS — F429 Obsessive-compulsive disorder, unspecified: Secondary | ICD-10-CM | POA: Diagnosis not present

## 2024-09-06 DIAGNOSIS — F329 Major depressive disorder, single episode, unspecified: Secondary | ICD-10-CM | POA: Diagnosis not present

## 2024-09-06 DIAGNOSIS — F429 Obsessive-compulsive disorder, unspecified: Secondary | ICD-10-CM | POA: Diagnosis not present

## 2024-09-09 DIAGNOSIS — F329 Major depressive disorder, single episode, unspecified: Secondary | ICD-10-CM | POA: Diagnosis not present

## 2024-09-09 DIAGNOSIS — F429 Obsessive-compulsive disorder, unspecified: Secondary | ICD-10-CM | POA: Diagnosis not present

## 2024-09-16 DIAGNOSIS — F429 Obsessive-compulsive disorder, unspecified: Secondary | ICD-10-CM | POA: Diagnosis not present

## 2024-09-16 DIAGNOSIS — F329 Major depressive disorder, single episode, unspecified: Secondary | ICD-10-CM | POA: Diagnosis not present

## 2024-09-22 DIAGNOSIS — F419 Anxiety disorder, unspecified: Secondary | ICD-10-CM | POA: Diagnosis not present

## 2024-09-22 DIAGNOSIS — F331 Major depressive disorder, recurrent, moderate: Secondary | ICD-10-CM | POA: Diagnosis not present

## 2024-09-23 DIAGNOSIS — F429 Obsessive-compulsive disorder, unspecified: Secondary | ICD-10-CM | POA: Diagnosis not present

## 2024-09-23 DIAGNOSIS — F329 Major depressive disorder, single episode, unspecified: Secondary | ICD-10-CM | POA: Diagnosis not present

## 2024-09-27 ENCOUNTER — Other Ambulatory Visit

## 2024-09-27 ENCOUNTER — Other Ambulatory Visit: Payer: Self-pay

## 2024-09-27 DIAGNOSIS — R7989 Other specified abnormal findings of blood chemistry: Secondary | ICD-10-CM | POA: Diagnosis not present

## 2024-09-27 LAB — HEPATIC FUNCTION PANEL
ALT: 39 U/L — ABNORMAL HIGH (ref 0–35)
AST: 20 U/L (ref 0–37)
Albumin: 4.4 g/dL (ref 3.5–5.2)
Alkaline Phosphatase: 96 U/L (ref 39–117)
Bilirubin, Direct: 0.2 mg/dL (ref 0.0–0.3)
Total Bilirubin: 1.1 mg/dL (ref 0.2–1.2)
Total Protein: 8 g/dL (ref 6.0–8.3)

## 2024-09-28 ENCOUNTER — Ambulatory Visit: Payer: Self-pay | Admitting: Physician Assistant

## 2024-09-30 DIAGNOSIS — F429 Obsessive-compulsive disorder, unspecified: Secondary | ICD-10-CM | POA: Diagnosis not present

## 2024-09-30 DIAGNOSIS — F329 Major depressive disorder, single episode, unspecified: Secondary | ICD-10-CM | POA: Diagnosis not present

## 2024-10-03 DIAGNOSIS — F332 Major depressive disorder, recurrent severe without psychotic features: Secondary | ICD-10-CM | POA: Diagnosis not present

## 2024-10-04 DIAGNOSIS — F332 Major depressive disorder, recurrent severe without psychotic features: Secondary | ICD-10-CM | POA: Diagnosis not present

## 2024-10-05 DIAGNOSIS — F332 Major depressive disorder, recurrent severe without psychotic features: Secondary | ICD-10-CM | POA: Diagnosis not present

## 2024-10-07 DIAGNOSIS — F429 Obsessive-compulsive disorder, unspecified: Secondary | ICD-10-CM | POA: Diagnosis not present

## 2024-10-07 DIAGNOSIS — F329 Major depressive disorder, single episode, unspecified: Secondary | ICD-10-CM | POA: Diagnosis not present

## 2024-10-10 DIAGNOSIS — F329 Major depressive disorder, single episode, unspecified: Secondary | ICD-10-CM | POA: Diagnosis not present

## 2024-10-10 DIAGNOSIS — F429 Obsessive-compulsive disorder, unspecified: Secondary | ICD-10-CM | POA: Diagnosis not present

## 2024-10-11 DIAGNOSIS — F419 Anxiety disorder, unspecified: Secondary | ICD-10-CM | POA: Diagnosis not present

## 2024-10-11 DIAGNOSIS — F331 Major depressive disorder, recurrent, moderate: Secondary | ICD-10-CM | POA: Diagnosis not present

## 2024-10-12 DIAGNOSIS — Z79899 Other long term (current) drug therapy: Secondary | ICD-10-CM | POA: Diagnosis not present

## 2024-10-14 DIAGNOSIS — F329 Major depressive disorder, single episode, unspecified: Secondary | ICD-10-CM | POA: Diagnosis not present

## 2024-10-14 DIAGNOSIS — F429 Obsessive-compulsive disorder, unspecified: Secondary | ICD-10-CM | POA: Diagnosis not present

## 2024-10-24 DIAGNOSIS — F329 Major depressive disorder, single episode, unspecified: Secondary | ICD-10-CM | POA: Diagnosis not present

## 2024-10-24 DIAGNOSIS — F429 Obsessive-compulsive disorder, unspecified: Secondary | ICD-10-CM | POA: Diagnosis not present

## 2024-10-27 DIAGNOSIS — F419 Anxiety disorder, unspecified: Secondary | ICD-10-CM | POA: Diagnosis not present

## 2024-10-27 DIAGNOSIS — F331 Major depressive disorder, recurrent, moderate: Secondary | ICD-10-CM | POA: Diagnosis not present

## 2024-10-28 DIAGNOSIS — F329 Major depressive disorder, single episode, unspecified: Secondary | ICD-10-CM | POA: Diagnosis not present

## 2024-10-28 DIAGNOSIS — F429 Obsessive-compulsive disorder, unspecified: Secondary | ICD-10-CM | POA: Diagnosis not present

## 2024-10-31 DIAGNOSIS — F329 Major depressive disorder, single episode, unspecified: Secondary | ICD-10-CM | POA: Diagnosis not present

## 2024-10-31 DIAGNOSIS — F429 Obsessive-compulsive disorder, unspecified: Secondary | ICD-10-CM | POA: Diagnosis not present

## 2024-11-07 DIAGNOSIS — F329 Major depressive disorder, single episode, unspecified: Secondary | ICD-10-CM | POA: Diagnosis not present

## 2024-11-07 DIAGNOSIS — F429 Obsessive-compulsive disorder, unspecified: Secondary | ICD-10-CM | POA: Diagnosis not present

## 2024-11-11 ENCOUNTER — Encounter: Payer: Self-pay | Admitting: Family Medicine

## 2024-11-11 ENCOUNTER — Ambulatory Visit: Admitting: Family Medicine

## 2024-11-11 VITALS — BP 124/86 | HR 85 | Temp 97.8°F | Ht 66.0 in | Wt 188.0 lb

## 2024-11-11 DIAGNOSIS — F429 Obsessive-compulsive disorder, unspecified: Secondary | ICD-10-CM | POA: Diagnosis not present

## 2024-11-11 DIAGNOSIS — E559 Vitamin D deficiency, unspecified: Secondary | ICD-10-CM | POA: Diagnosis not present

## 2024-11-11 DIAGNOSIS — F419 Anxiety disorder, unspecified: Secondary | ICD-10-CM

## 2024-11-11 DIAGNOSIS — F329 Major depressive disorder, single episode, unspecified: Secondary | ICD-10-CM | POA: Diagnosis not present

## 2024-11-11 DIAGNOSIS — F32A Depression, unspecified: Secondary | ICD-10-CM | POA: Diagnosis not present

## 2024-11-11 DIAGNOSIS — R748 Abnormal levels of other serum enzymes: Secondary | ICD-10-CM

## 2024-11-11 DIAGNOSIS — R030 Elevated blood-pressure reading, without diagnosis of hypertension: Secondary | ICD-10-CM

## 2024-11-11 NOTE — Progress Notes (Signed)
 "  Subjective:     Patient ID: Yesenia Johnson, female    DOB: [birth year], 38 y.o.   MRN: [id number]  Chief Complaint  Patient presents with   Blood Pressure Check    Hospitalized Nov 8 for suicidal ideations, BP was high during this time generally between 140-170. Given seroquel and BP plumitied. Taking BP 2x daily since back on the 13th and can range from 125-150 bottom number 80s-90s.    HPI  Discussed the use of AI scribe software for clinical note transcription with the patient, who gave verbal consent to proceed.  History of Present Illness Yesenia Johnson is a 38 year old female who presents with concerns regarding home blood pressure readings.  Elevated blood pressure readings - Home blood pressure readings approximately 132/90 mmHg since November - Uncertainty regarding accuracy of home blood pressure monitor purchased in November - No current use of antihypertensive medications - Concern about potential drug interactions if antihypertensive therapy is initiated while taking lithium - Family history of hypertension - Actively attempting dietary improvements  Psychiatric history and medication management - Recent psychiatric hospitalization at [facility] - Currently attends therapy twice weekly - Mental health stable on current regimen: Cymbalta, lithium, vitamin D , and Xyzal  Liver function abnormalities - History of elevated liver function tests, which normalized after discontinuing Tylenol  for headaches - followed by GI  Tobacco use - No tobacco use     Health Maintenance Due  Topic Date Due   DTaP/Tdap/Td (1 - Tdap) Never done   Hepatitis B Vaccines 19-59 Average Risk (1 of 3 - 19+ 3-dose series) Never done   HPV VACCINES (1 - 3-dose SCDM series) Never done   COVID-19 Vaccine (4 - 2025-26 season) 07/25/2024    Past Medical History:  Diagnosis Date   Allergy Mid 1990s   Anxiety Unknown   Depression     Past Surgical History:  Procedure  Laterality Date   CHOLECYSTECTOMY N/A 03/24/2020   Procedure: LAPAROSCOPIC CHOLECYSTECTOMY WITH INTRAOPERATIVE CHOLANGIOGRAM;  Surgeon: Eletha Boas, MD;  Location: [facility];  Service: General;  Laterality: N/A;   COLPOSCOPY     WISDOM TOOTH EXTRACTION  11/2016    Family History  Problem Relation Age of Onset   Hypertension Mother    Diabetes Mother    Arthritis Mother    Obesity Mother    Stroke Maternal Grandmother    Diabetes Paternal Grandmother    Breast cancer Maternal Aunt    Cancer Maternal Aunt    Alcoholism Maternal Aunt    Diabetes Paternal Aunt     Social History   Socioeconomic History   Marital status: Divorced    Spouse name: Not on file   Number of children: Not on file   Years of education: Not on file   Highest education level: Master's degree (e.g., MA, MS, MEng, MEd, MSW, MBA)  Occupational History   Not on file  Tobacco Use   Smoking status: Never   Smokeless tobacco: Never  Vaping Use   Vaping status: Never Used  Substance and Sexual Activity   Alcohol use: Yes    Alcohol/week: 2.0 standard drinks of alcohol    Types: 2 Cans of beer per week    Comment: 3-4 drinks per week    Drug use: No   Sexual activity: Yes    Partners: Male    Birth control/protection: None  Other Topics Concern   Not on file  Social History Narrative   Not on file  Social Drivers of Health   Tobacco Use: Low Risk (11/11/2024)   Patient History    Smoking Tobacco Use: Never    Smokeless Tobacco Use: Never    Passive Exposure: Not on file  Financial Resource Strain: Low Risk (11/10/2024)   Overall Financial Resource Strain (CARDIA)    Difficulty of Paying Living Expenses: Not very hard  Food Insecurity: No Food Insecurity (11/10/2024)   Epic    Worried About Programme Researcher, Broadcasting/film/video in the Last Year: Never true    Ran Out of Food in the Last Year: Never true  Transportation Needs: No Transportation Needs (11/10/2024)   Epic    Lack of Transportation (Medical): No     Lack of Transportation (Non-Medical): No  Physical Activity: Sufficiently Active (11/10/2024)   Exercise Vital Sign    Days of Exercise per Week: 7 days    Minutes of Exercise per Session: 50 min  Stress: Stress Concern Present (11/10/2024)   [employer] of Occupational Health - Occupational Stress Questionnaire    Feeling of Stress: To some extent  Social Connections: Moderately Isolated (11/10/2024)   Social Connection and Isolation Panel    Frequency of Communication with Friends and Family: More than three times a week    Frequency of Social Gatherings with Friends and Family: Once a week    Attends Religious Services: Never    Database Administrator or Organizations: Yes    Attends Engineer, Structural: More than 4 times per year    Marital Status: Divorced  Intimate Partner Violence: Not on file  Depression (PHQ2-9): Medium Risk (11/11/2024)   Depression (PHQ2-9)    PHQ-2 Score: 5  Alcohol Screen: Low Risk (11/10/2024)   Alcohol Screen    Last Alcohol Screening Score (AUDIT): 1  Housing: Low Risk (11/10/2024)   Epic    Unable to Pay for Housing in the Last Year: No    Number of Times Moved in the Last Year: 0    Homeless in the Last Year: No  Utilities: Not on file  Health Literacy: Not on file    Outpatient Medications Prior to Visit  Medication Sig Dispense Refill   DULoxetine (CYMBALTA) 60 MG capsule Take 60 mg by mouth every morning.     levocetirizine (XYZAL) 5 MG tablet      lithium carbonate 300 MG capsule Take 300 mg by mouth at bedtime.     VITAMIN D       TRINTELLIX 10 MG TABS tablet Take 10 mg by mouth daily.     No facility-administered medications prior to visit.    Allergies[1]  Review of Systems  Constitutional:  Negative for chills and fever.  Respiratory:  Negative for shortness of breath.   Cardiovascular:  Negative for chest pain, palpitations and leg swelling.  Gastrointestinal:  Negative for abdominal pain, constipation,  diarrhea, nausea and vomiting.  Genitourinary:  Negative for dysuria, frequency and urgency.  Neurological:  Negative for dizziness and headaches.       Objective:    Physical Exam Constitutional:      General: She is not in acute distress.    Appearance: She is not ill-appearing.  Eyes:     Extraocular Movements: Extraocular movements intact.     Conjunctiva/sclera: Conjunctivae normal.  Cardiovascular:     Rate and Rhythm: Normal rate.  Pulmonary:     Effort: Pulmonary effort is normal.  Musculoskeletal:     Cervical back: Normal range of motion and neck supple.  Skin:  General: Skin is warm and dry.  Neurological:     General: No focal deficit present.     Mental Status: She is alert and oriented to person, place, and time.  Psychiatric:        Mood and Affect: Mood normal.        Behavior: Behavior normal.        Thought Content: Thought content normal.      BP 124/86   Pulse 85   Temp 97.8 F (36.6 C) (Temporal)   Ht 5' 6 (1.676 m)   Wt 188 lb (85.3 kg)   SpO2 99%   BMI 30.34 kg/m  Wt Readings from Last 3 Encounters:  11/11/24 188 lb (85.3 kg)  07/29/24 189 lb (85.7 kg)  04/21/24 192 lb (87.1 kg)       Assessment & Plan:   Problem List Items Addressed This Visit     Vitamin D  deficiency   Other Visit Diagnoses       Elevated blood pressure reading in office without diagnosis of hypertension    -  Primary     Anxiety and depression       Relevant Medications   DULoxetine (CYMBALTA) 60 MG capsule     Elevated liver enzymes           Assessment and Plan Assessment & Plan Elevated blood pressure Home readings are inconsistent, possibly due to inaccurate blood pressure cuff. In-office reading was 132/90 mmHg. No immediate need for antihypertensive medication.  - Monitor blood pressure at home over the next 6-8 weeks. - Bring in blood pressure readings and machine for review. - Consider obtaining a new blood pressure cuff for accurate  readings. - Follow up in office in 6-8 weeks for re-evaluation. - Provided DASH eating plan for dietary modifications.  Anxiety and depression Reports significant improvement in mental health status, attributed to current medication regimen and increased frequency of therapy sessions. Currently on Cymbalta, lithium, and vitamin D . Engaged in therapy twice a week, which has contributed to her improved mental state. - Continue current medication regimen including Cymbalta, lithium, and vitamin D . - Continue therapy sessions twice a week.  Vitamin D  deficiency Currently taking vitamin D  supplements. Previous blood work showed low vitamin D  levels, but no recent blood work was done today. - Will consider labs at next follow-up, including vitamin D  level.     I have discontinued Alan HERO. Moncus's Trintellix. I am also having her maintain her levocetirizine, DULoxetine, lithium carbonate, and VITAMIN D .  No orders of the defined types were placed in this encounter.      [1]  Allergies Allergen Reactions   Penicillins Hives    Did it involve swelling of the face/tongue/throat, SOB, or low BP? Y Did it involve sudden or severe rash/hives, skin peeling, or any reaction on the inside of your mouth or nose? Y Did you need to seek medical attention at a hospital or doctor's office? Y When did it last happen? infancy      If all above answers are NO, may proceed with cephalosporin use.    "

## 2024-11-11 NOTE — Patient Instructions (Signed)
 Monitor your blood pressure at home over the next 6 to 8 weeks and follow-up with me please

## 2024-11-21 DIAGNOSIS — F429 Obsessive-compulsive disorder, unspecified: Secondary | ICD-10-CM | POA: Diagnosis not present

## 2024-11-21 DIAGNOSIS — F329 Major depressive disorder, single episode, unspecified: Secondary | ICD-10-CM | POA: Diagnosis not present
